# Patient Record
Sex: Female | Born: 1995 | Race: Black or African American | Hispanic: No | Marital: Single | Smoking: Never smoker
Health system: Southern US, Community
[De-identification: ages and names within clinical notes are randomized; demographics above are authoritative.]

## PROBLEM LIST (undated history)

## (undated) DIAGNOSIS — R7303 Prediabetes: Secondary | ICD-10-CM

## (undated) DIAGNOSIS — E119 Type 2 diabetes mellitus without complications: Secondary | ICD-10-CM

## (undated) DIAGNOSIS — J45909 Unspecified asthma, uncomplicated: Secondary | ICD-10-CM

## (undated) DIAGNOSIS — E282 Polycystic ovarian syndrome: Secondary | ICD-10-CM

## (undated) DIAGNOSIS — F329 Major depressive disorder, single episode, unspecified: Secondary | ICD-10-CM

## (undated) DIAGNOSIS — A599 Trichomoniasis, unspecified: Secondary | ICD-10-CM

## (undated) DIAGNOSIS — F419 Anxiety disorder, unspecified: Secondary | ICD-10-CM

## (undated) DIAGNOSIS — F319 Bipolar disorder, unspecified: Secondary | ICD-10-CM

## (undated) DIAGNOSIS — F32A Depression, unspecified: Secondary | ICD-10-CM

## (undated) HISTORY — DX: Type 2 diabetes mellitus without complications: E11.9

## (undated) HISTORY — PX: NO PAST SURGERIES: SHX2092

---

## 2017-12-24 DIAGNOSIS — E559 Vitamin D deficiency, unspecified: Secondary | ICD-10-CM | POA: Insufficient documentation

## 2017-12-24 DIAGNOSIS — R7303 Prediabetes: Secondary | ICD-10-CM | POA: Insufficient documentation

## 2018-02-25 DIAGNOSIS — F319 Bipolar disorder, unspecified: Secondary | ICD-10-CM | POA: Insufficient documentation

## 2018-12-24 DIAGNOSIS — H5203 Hypermetropia, bilateral: Secondary | ICD-10-CM | POA: Diagnosis not present

## 2019-01-06 ENCOUNTER — Encounter (HOSPITAL_COMMUNITY): Payer: Self-pay | Admitting: Emergency Medicine

## 2019-01-06 ENCOUNTER — Ambulatory Visit (HOSPITAL_COMMUNITY)
Admission: EM | Admit: 2019-01-06 | Discharge: 2019-01-06 | Disposition: A | Discharge status: Home / Self Care | Payer: Medicaid Other | Attending: Family Medicine | Admitting: Family Medicine

## 2019-01-06 DIAGNOSIS — N926 Irregular menstruation, unspecified: Secondary | ICD-10-CM | POA: Diagnosis not present

## 2019-01-06 DIAGNOSIS — N898 Other specified noninflammatory disorders of vagina: Secondary | ICD-10-CM | POA: Diagnosis not present

## 2019-01-06 DIAGNOSIS — N3090 Cystitis, unspecified without hematuria: Secondary | ICD-10-CM | POA: Diagnosis not present

## 2019-01-06 LAB — POCT URINALYSIS DIP (DEVICE)
Bilirubin Urine: NEGATIVE
Glucose, UA: NEGATIVE mg/dL
Ketones, ur: NEGATIVE mg/dL
Nitrite: NEGATIVE
Protein, ur: NEGATIVE mg/dL
Specific Gravity, Urine: 1.015 (ref 1.005–1.030)
Urobilinogen, UA: 0.2 mg/dL (ref 0.0–1.0)
pH: 7 (ref 5.0–8.0)

## 2019-01-06 LAB — POCT PREGNANCY, URINE: Preg Test, Ur: NEGATIVE

## 2019-01-06 MED ORDER — METRONIDAZOLE 500 MG PO TABS: 500.0000 mg | ORAL_TABLET | Freq: Two times a day (BID) | ORAL | 0 refills | Status: DC

## 2019-01-06 MED ORDER — FLUCONAZOLE 150 MG PO TABS: ORAL_TABLET | ORAL | 0 refills | Status: DC

## 2019-01-06 MED ORDER — CEPHALEXIN 500 MG PO CAPS: 500.0000 mg | ORAL_CAPSULE | Freq: Two times a day (BID) | ORAL | 0 refills | Status: DC

## 2019-01-06 NOTE — ED Triage Notes (Signed)
Pt here for UTI sx and vaginal discharge

## 2019-01-07 LAB — CERVICOVAGINAL ANCILLARY ONLY
Bacterial vaginitis: NEGATIVE
Candida vaginitis: NEGATIVE
Chlamydia: NEGATIVE
Neisseria Gonorrhea: NEGATIVE
Trichomonas: POSITIVE — AB

## 2019-01-08 LAB — URINE CULTURE

## 2019-01-09 NOTE — ED Provider Notes (Signed)
[facility]   [id number] 01/06/19 Arrival Time: 1709  ASSESSMENT & PLAN:  1. Cystitis   2. Vaginal discharge   3. Irregular menstrual cycle    UPT negative.  Will treat empirically for UTI and BV/yeast. Vaginal cytology and urine culture pending.  Meds ordered this encounter  Medications  . cephALEXin (KEFLEX) 500 MG capsule    Sig: Take 1 capsule (500 mg total) by mouth 2 (two) times daily.    Dispense:  10 capsule    Refill:  0  . metroNIDAZOLE (FLAGYL) 500 MG tablet    Sig: Take 1 tablet (500 mg total) by mouth 2 (two) times daily.    Dispense:  14 tablet    Refill:  0  . fluconazole (DIFLUCAN) 150 MG tablet    Sig: Take one tablet by mouth as a single dose. May repeat in 3 days if needed.    Dispense:  2 tablet    Refill:  0   Will notify of any positive results. Instructed to refrain from sexual activity for at least seven days.  Reviewed expectations re: course of current medical issues. Questions answered. Outlined signs and symptoms indicating need for more acute intervention. Patient verbalized understanding. After Visit Summary given.   SUBJECTIVE:  Jordan Williams is a 23 y.o. female who presents with complaint of vaginal discharge and dysuria. Onset abrupt. First noticed several days ago; maybe longer. Describes discharge as thin and white/clear. Questions slight odor. Some vaginal itching. No hematuria. Afebrile. No abdominal or pelvic pain. No n/v. No rashes or lesions. Sexually active with single female partner. OTC treatment: none. H/O BV and yeast infection reported; "feels the same".  No LMP recorded. Irregular periods. Requests UPT.  ROS: As per HPI. All other systems negative   OBJECTIVE:  Vitals:   01/06/19 1732  BP: 99/76  Pulse: (!) 104  Resp: 18  Temp: 98.1 F (36.7 C)  SpO2: 99%    General appearance: alert, cooperative, appears stated age and no distress Throat: lips, mucosa, and tongue normal; teeth and gums normal CV:  RRR Lungs: CTAB Back: no CVA tenderness; FROM at waist Abdomen: soft, non-tender GU: deferred Skin: warm and dry Psychological: alert and cooperative; normal mood and affect.  Results for orders placed or performed during the hospital encounter of 01/06/19  Urine culture  Result Value Ref Range   Specimen Description URINE, RANDOM    Special Requests      NONE Performed at [facility], [address] [place], [place] [address]    Culture      Multiple bacterial morphotypes present, none predominant. Suggest appropriate recollection if clinically indicated.   Report Status 01/08/2019 FINAL   POCT urinalysis dip (device)  Result Value Ref Range   Glucose, UA NEGATIVE NEGATIVE mg/dL   Bilirubin Urine NEGATIVE NEGATIVE   Ketones, ur NEGATIVE NEGATIVE mg/dL   Specific Gravity, Urine 1.015 1.005 - 1.030   Hgb urine dipstick MODERATE (A) NEGATIVE   pH 7.0 5.0 - 8.0   Protein, ur NEGATIVE NEGATIVE mg/dL   Urobilinogen, UA 0.2 0.0 - 1.0 mg/dL   Nitrite NEGATIVE NEGATIVE   Leukocytes, UA MODERATE (A) NEGATIVE  Pregnancy, urine POC  Result Value Ref Range   Preg Test, Ur NEGATIVE NEGATIVE  Cervicovaginal ancillary only  Result Value Ref Range   Bacterial vaginitis Negative for Bacterial Vaginitis Microorganisms    Candida vaginitis Negative for Candida species    Chlamydia Negative    Neisseria gonorrhea Negative  Trichomonas **POSITIVE** (A)     Labs Reviewed  POCT URINALYSIS DIP (DEVICE) - Abnormal; Notable for the following components:      Result Value   Hgb urine dipstick MODERATE (*)    Leukocytes, UA MODERATE (*)    All other components within normal limits  CERVICOVAGINAL ANCILLARY ONLY - Abnormal; Notable for the following components:   Trichomonas **POSITIVE** (*)    All other components within normal limits  URINE CULTURE  POCT PREGNANCY, URINE    No Known Allergies  PMH: As in HPI.  FH: Question of HTN.  Social History   Socioeconomic History   . Marital status: Single    Spouse name: Not on file  . Number of children: Not on file  . Years of education: Not on file  . Highest education level: Not on file  Occupational History  . Not on file  Social Needs  . Financial resource strain: Not on file  . Food insecurity:    Worry: Not on file    Inability: Not on file  . Transportation needs:    Medical: Not on file    Non-medical: Not on file  Tobacco Use  . Smoking status: Never Smoker  . Smokeless tobacco: Never Used  Substance and Sexual Activity  . Alcohol use: Not Currently  . Drug use: Never  . Sexual activity: Not on file  Lifestyle  . Physical activity:    Days per week: Not on file    Minutes per session: Not on file  . Stress: Not on file  Relationships  . Social connections:    Talks on phone: Not on file    Gets together: Not on file    Attends religious service: Not on file    Active member of club or organization: Not on file    Attends meetings of clubs or organizations: Not on file    Relationship status: Not on file  . Intimate partner violence:    Fear of current or ex partner: Not on file    Emotionally abused: Not on file    Physically abused: Not on file    Forced sexual activity: Not on file  Other Topics Concern  . Not on file  Social History Narrative  . Not on file          Mardella LaymanHagler, Daymion Nazaire, MD 01/09/19 [phone]

## 2019-01-11 ENCOUNTER — Telehealth (HOSPITAL_COMMUNITY): Payer: Self-pay | Admitting: Emergency Medicine

## 2019-01-11 NOTE — Telephone Encounter (Signed)
Trichomonas is positive. Flagyl was given at [facility] visit. Pt needs education to refrain from sexual intercourse for 7 days to give the medicine time to work. Sexual partners need to be notified and tested/treated. Condoms may reduce risk of reinfection. Recheck for further evaluation if symptoms are not improving.   Patient contacted and made aware of all results, all questions answered.

## 2019-01-18 ENCOUNTER — Encounter (HOSPITAL_COMMUNITY): Payer: Self-pay

## 2019-01-18 ENCOUNTER — Other Ambulatory Visit: Payer: Self-pay

## 2019-01-18 ENCOUNTER — Ambulatory Visit (HOSPITAL_COMMUNITY)
Admission: EM | Admit: 2019-01-18 | Discharge: 2019-01-18 | Disposition: A | Discharge status: Home / Self Care | Payer: Medicaid Other | Attending: Family Medicine | Admitting: Family Medicine

## 2019-01-18 DIAGNOSIS — Z113 Encounter for screening for infections with a predominantly sexual mode of transmission: Secondary | ICD-10-CM | POA: Insufficient documentation

## 2019-01-18 NOTE — ED Provider Notes (Signed)
[facility]    CSN: [id number] Arrival date & time: 01/18/19  1312     History   Chief Complaint Chief Complaint  Patient presents with  . Appointment    200  . SEXUALLY TRANSMITTED DISEASE    HPI Dia SitterKiyelle Marlett is a 23 y.o. female.   She is a 23 year old female that presents today for STD screening.  She was seen here on 01/06/2019 where she had STD screening done at that point.  She was found to be positive for trichomonas.  She has completed the treatment for trichomonas.  She is here for recheck to ensure the infection has cleared. She is denying any symptoms. She otherwise feels okay.      History reviewed. No pertinent past medical history.  There are no active problems to display for this patient.   History reviewed. No pertinent surgical history.  OB History   No obstetric history on file.      Home Medications    Prior to Admission medications   Not on File    Family History History reviewed. No pertinent family history.  Social History Social History   Tobacco Use  . Smoking status: Never Smoker  . Smokeless tobacco: Never Used  Substance Use Topics  . Alcohol use: Not Currently  . Drug use: Never     Allergies   Patient has no known allergies.   Review of Systems Review of Systems  Constitutional: Negative for activity change, fatigue and fever.  Gastrointestinal: Negative for abdominal pain.  Genitourinary: Negative for decreased urine volume, difficulty urinating, dyspareunia, dysuria, flank pain, frequency, genital sores, hematuria, menstrual problem, pelvic pain, urgency, vaginal bleeding, vaginal discharge and vaginal pain.     Physical Exam Triage Vital Signs ED Triage Vitals  Enc Vitals Group     BP 01/18/19 1345 128/75     Pulse Rate 01/18/19 1345 96     Resp 01/18/19 1345 18     Temp 01/18/19 1345 98.2 F (36.8 C)     Temp Source 01/18/19 1345 Tympanic     SpO2 01/18/19 1345 100 %     Weight 01/18/19 1343  235 lb (106.6 kg)     Height --      Head Circumference --      Peak Flow --      Pain Score 01/18/19 1343 0     Pain Loc --      Pain Edu? --      Excl. in GC? --    No data found.  Updated Vital Signs BP 128/75 (BP Location: Right Arm)   Pulse 96   Temp 98.2 F (36.8 C) (Tympanic)   Resp 18   Wt 235 lb (106.6 kg)   SpO2 100%   Visual Acuity Right Eye Distance:   Left Eye Distance:   Bilateral Distance:    Right Eye Near:   Left Eye Near:    Bilateral Near:     Physical Exam Vitals signs and nursing note reviewed.  Constitutional:      General: She is not in acute distress.    Appearance: She is well-developed.  HENT:     Head: Normocephalic and atraumatic.  Eyes:     Conjunctiva/sclera: Conjunctivae normal.  Neck:     Musculoskeletal: Neck supple.  Cardiovascular:     Rate and Rhythm: Normal rate and regular rhythm.     Heart sounds: No murmur.  Pulmonary:     Effort: Pulmonary effort is normal. No respiratory  distress.     Breath sounds: Normal breath sounds.  Abdominal:     Palpations: Abdomen is soft.     Tenderness: There is no abdominal tenderness.  Genitourinary:    Comments: Deferred  Skin:    General: Skin is warm and dry.  Neurological:     Mental Status: She is alert.      UC Treatments / Results  Labs (all labs ordered are listed, but only abnormal results are displayed) Labs Reviewed  CERVICOVAGINAL ANCILLARY ONLY    EKG None  Radiology No results found.  Procedures Procedures (including critical care time)  Medications Ordered in UC Medications - No data to display  Initial Impression / Assessment and Plan / UC Course  I have reviewed the triage vital signs and the nursing notes.  Pertinent labs & imaging results that were available during my care of the patient were reviewed by me and considered in my medical decision making (see chart for details).     Rescreening patient today for STDs Lab results pending We  will call with any positive results Final Clinical Impressions(s) / UC Diagnoses   Final diagnoses:  Screening for STD (sexually transmitted disease)     Discharge Instructions     We are rescreening you here today for STDs We will call with any positive results Follow up as needed for continued or worsening symptoms     ED Prescriptions    None     Controlled Substance Prescriptions [place] Controlled Substance Registry consulted? Not Applicable   Janace Aris, NP 01/18/19 1454

## 2019-01-18 NOTE — Discharge Instructions (Signed)
We are rescreening you here today for STDs We will call with any positive results Follow up as needed for continued or worsening symptoms

## 2019-01-18 NOTE — ED Triage Notes (Signed)
Pt cc she has a appt for her annual STD testing pt states she wants to make sure that hert STD has went away as well.

## 2019-01-19 LAB — CERVICOVAGINAL ANCILLARY ONLY
Bacterial vaginitis: NEGATIVE
Candida vaginitis: NEGATIVE
Chlamydia: NEGATIVE
Neisseria Gonorrhea: NEGATIVE
Trichomonas: NEGATIVE

## 2019-02-12 DIAGNOSIS — Z113 Encounter for screening for infections with a predominantly sexual mode of transmission: Secondary | ICD-10-CM | POA: Diagnosis not present

## 2019-02-12 DIAGNOSIS — Z3202 Encounter for pregnancy test, result negative: Secondary | ICD-10-CM | POA: Diagnosis not present

## 2019-02-12 DIAGNOSIS — Z30017 Encounter for initial prescription of implantable subdermal contraceptive: Secondary | ICD-10-CM | POA: Diagnosis not present

## 2019-03-01 ENCOUNTER — Encounter: Payer: Self-pay | Admitting: Emergency Medicine

## 2019-03-01 ENCOUNTER — Ambulatory Visit
Admission: EM | Admit: 2019-03-01 | Discharge: 2019-03-01 | Disposition: A | Discharge status: Home / Self Care | Payer: Medicaid Other | Attending: Family Medicine | Admitting: Family Medicine

## 2019-03-01 DIAGNOSIS — Z7251 High risk heterosexual behavior: Secondary | ICD-10-CM | POA: Insufficient documentation

## 2019-03-01 DIAGNOSIS — Z202 Contact with and (suspected) exposure to infections with a predominantly sexual mode of transmission: Secondary | ICD-10-CM | POA: Diagnosis not present

## 2019-03-01 NOTE — ED Triage Notes (Signed)
Pt presents to [facility] for her "annual STD check".
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
## 2019-03-01 NOTE — Discharge Instructions (Addendum)
We did lab testing during this visit.  If there are any abnormal findings that require change in medicine or indicate a positive result, you will be notified.  If all of your tests are normal, you will not be called.   ° °

## 2019-03-01 NOTE — ED Notes (Signed)
Patient able to ambulate independently  

## 2019-03-02 LAB — HIV ANTIBODY (ROUTINE TESTING W REFLEX): HIV Screen 4th Generation wRfx: NONREACTIVE

## 2019-03-02 LAB — RPR: RPR Ser Ql: NONREACTIVE

## 2019-03-03 LAB — CERVICOVAGINAL ANCILLARY ONLY
Chlamydia: NEGATIVE
Neisseria Gonorrhea: NEGATIVE
Trichomonas: POSITIVE — AB

## 2019-03-04 ENCOUNTER — Encounter (HOSPITAL_COMMUNITY): Payer: Self-pay

## 2019-03-04 ENCOUNTER — Telehealth (HOSPITAL_COMMUNITY): Payer: Self-pay | Admitting: Emergency Medicine

## 2019-03-04 MED ORDER — METRONIDAZOLE 500 MG PO TABS: 2000.0000 mg | ORAL_TABLET | Freq: Once | ORAL | 0 refills | Status: AC

## 2019-03-04 NOTE — Telephone Encounter (Signed)
Trichomonas is positive. Rx  for Flagyl 2 grams, once was sent to the pharmacy of record. Pt needs education to refrain from sexual intercourse for 7 days to give the medicine time to work. Sexual partners need to be notified and tested/treated. Condoms may reduce risk of reinfection. Recheck for further evaluation if symptoms are not improving.   Attempted to reach patient. No answer at this time. No voicemail set up.

## 2019-03-04 NOTE — Telephone Encounter (Signed)
Patient called back, all questions answered.

## 2019-03-05 ENCOUNTER — Telehealth: Payer: Self-pay | Admitting: Emergency Medicine

## 2019-03-11 ENCOUNTER — Ambulatory Visit
Admission: EM | Admit: 2019-03-11 | Discharge: 2019-03-11 | Disposition: A | Discharge status: Home / Self Care | Payer: Medicaid Other | Attending: Physician Assistant | Admitting: Physician Assistant

## 2019-03-11 DIAGNOSIS — Z113 Encounter for screening for infections with a predominantly sexual mode of transmission: Secondary | ICD-10-CM | POA: Diagnosis not present

## 2019-03-11 LAB — POCT URINALYSIS DIP (MANUAL ENTRY)
Bilirubin, UA: NEGATIVE
Blood, UA: NEGATIVE
Glucose, UA: NEGATIVE mg/dL
Ketones, POC UA: NEGATIVE mg/dL
Leukocytes, UA: NEGATIVE
Nitrite, UA: NEGATIVE
Protein Ur, POC: NEGATIVE mg/dL
Spec Grav, UA: 1.025 (ref 1.010–1.025)
Urobilinogen, UA: 1 E.U./dL — AB
pH, UA: 7 (ref 5.0–8.0)

## 2019-03-11 NOTE — Discharge Instructions (Signed)
Cytology sent, you will be contacted with any positive results that requires further treatment. Refrain from sexual activity until partner has had treatment for 7 days.

## 2019-03-11 NOTE — ED Provider Notes (Signed)
[facility]    CSN: [id number] Arrival date & time: 03/11/19  1201     History   Chief Complaint Chief Complaint  Patient presents with  . Follow-up    HPI Jordan Williams is a 23 y.o. female.   23 year old female comes in for STD recheck. She was treated for trichomonas last week. Took flagyl 2g by mouth. States would like retesting. She denies symptoms. Denies abdominal pain, nausea, vomiting. Denies urinary frequency, dysuria, hematuria. Denies vaginal discharge, itching, pain. Has not had any sexual activity since treatment. States partner also received treatment.      History reviewed. No pertinent past medical history.  There are no active problems to display for this patient.   History reviewed. No pertinent surgical history.  OB History   No obstetric history on file.      Home Medications    Prior to Admission medications   Not on File    Family History No family history on file.  Social History Social History   Tobacco Use  . Smoking status: Never Smoker  . Smokeless tobacco: Never Used  Substance Use Topics  . Alcohol use: Not Currently  . Drug use: Never     Allergies   Patient has no known allergies.   Review of Systems Review of Systems  Reason unable to perform ROS: See HPI as above.     Physical Exam Triage Vital Signs ED Triage Vitals [03/11/19 1213]  Enc Vitals Group     BP 123/83     Pulse Rate 89     Resp 18     Temp 98 F (36.7 C)     Temp Source Oral     SpO2 98 %     Weight      Height      Head Circumference      Peak Flow      Pain Score 0     Pain Loc      Pain Edu?      Excl. in GC?    No data found.  Updated Vital Signs BP 123/83 (BP Location: Left Arm)   Pulse 89   Temp 98 F (36.7 C) (Oral)   Resp 18   SpO2 98%   Physical Exam Constitutional:      General: She is not in acute distress.    Appearance: She is well-developed. She is not diaphoretic.  HENT:     Head:  Normocephalic and atraumatic.  Eyes:     Conjunctiva/sclera: Conjunctivae normal.     Pupils: Pupils are equal, round, and reactive to light.  Neurological:     Mental Status: She is alert and oriented to person, place, and time.    UC Treatments / Results  Labs (all labs ordered are listed, but only abnormal results are displayed) Labs Reviewed  POCT URINALYSIS DIP (MANUAL ENTRY) - Abnormal; Notable for the following components:      Result Value   Urobilinogen, UA 1.0 (*)    All other components within normal limits  CERVICOVAGINAL ANCILLARY ONLY    EKG None  Radiology No results found.  Procedures Procedures (including critical care time)  Medications Ordered in UC Medications - No data to display  Initial Impression / Assessment and Plan / UC Course  I have reviewed the triage vital signs and the nursing notes.  Pertinent labs & imaging results that were available during my care of the patient were reviewed by me and considered in  my medical decision making (see chart for details).    Cytology sent. Discussed to wait till results prior to sexual activity. Return check as needed.   Final Clinical Impressions(s) / UC Diagnoses   Final diagnoses:  Screen for STD (sexually transmitted disease)    ED Prescriptions    None        Belinda Fisher, PA-C 03/11/19 1251

## 2019-03-11 NOTE — ED Triage Notes (Signed)
Pt states tx'd for trich over a week ago, wants to follow up that its gone. Pt denies s/s

## 2019-03-15 ENCOUNTER — Telehealth (HOSPITAL_COMMUNITY): Payer: Self-pay | Admitting: Emergency Medicine

## 2019-03-15 LAB — CERVICOVAGINAL ANCILLARY ONLY
Bacterial vaginitis: POSITIVE — AB
Candida vaginitis: NEGATIVE
Chlamydia: NEGATIVE
Neisseria Gonorrhea: NEGATIVE
Trichomonas: NEGATIVE

## 2019-03-15 MED ORDER — METRONIDAZOLE 500 MG PO TABS: 500.0000 mg | ORAL_TABLET | Freq: Two times a day (BID) | ORAL | 0 refills | Status: DC

## 2019-03-15 NOTE — Telephone Encounter (Signed)
Bacterial vaginosis is positive. This was not treated at the urgent care visit.  Flagyl 500 mg BID x 7 days #14 no refills sent to patients pharmacy of choice.    Spoke to pt verbalized understanding  

## 2019-04-22 ENCOUNTER — Other Ambulatory Visit: Payer: Self-pay

## 2019-04-22 ENCOUNTER — Emergency Department (HOSPITAL_COMMUNITY)
Admission: EM | Admit: 2019-04-22 | Discharge: 2019-04-23 | Disposition: A | Discharge status: Home / Self Care | Payer: Medicaid Other | Attending: Emergency Medicine | Admitting: Emergency Medicine

## 2019-04-22 ENCOUNTER — Ambulatory Visit (HOSPITAL_COMMUNITY)
Admission: EM | Admit: 2019-04-22 | Discharge: 2019-04-22 | Disposition: A | Discharge status: Home / Self Care | Payer: No Typology Code available for payment source | Attending: Emergency Medicine | Admitting: Emergency Medicine

## 2019-04-22 ENCOUNTER — Encounter (HOSPITAL_COMMUNITY): Payer: Self-pay | Admitting: Obstetrics and Gynecology

## 2019-04-22 DIAGNOSIS — T7421XA Adult sexual abuse, confirmed, initial encounter: Secondary | ICD-10-CM | POA: Diagnosis not present

## 2019-04-22 DIAGNOSIS — Z0441 Encounter for examination and observation following alleged adult rape: Secondary | ICD-10-CM | POA: Diagnosis not present

## 2019-04-22 DIAGNOSIS — F419 Anxiety disorder, unspecified: Secondary | ICD-10-CM | POA: Diagnosis not present

## 2019-04-22 HISTORY — DX: Major depressive disorder, single episode, unspecified: F32.9

## 2019-04-22 HISTORY — DX: Anxiety disorder, unspecified: F41.9

## 2019-04-22 HISTORY — DX: Depression, unspecified: F32.A

## 2019-04-22 MED ORDER — CEFTRIAXONE SODIUM 250 MG IJ SOLR
250.0000 mg | Freq: Once | INTRAMUSCULAR | Status: AC
  Administered 2019-04-22: 250 mg via INTRAMUSCULAR
  Filled 2019-04-22: qty 250

## 2019-04-22 MED ORDER — AZITHROMYCIN 250 MG PO TABS
1000.0000 mg | ORAL_TABLET | Freq: Once | ORAL | Status: AC
  Administered 2019-04-22: 1000 mg via ORAL
  Filled 2019-04-22: qty 4

## 2019-04-22 MED ORDER — LIDOCAINE HCL (PF) 1 % IJ SOLN
0.9000 mL | Freq: Once | INTRAMUSCULAR | Status: AC
  Administered 2019-04-22: 0.9 mL
  Filled 2019-04-22: qty 30

## 2019-04-22 MED ORDER — METRONIDAZOLE 500 MG PO TABS
2000.0000 mg | ORAL_TABLET | Freq: Once | ORAL | Status: AC
  Administered 2019-04-22: 2000 mg via ORAL
  Filled 2019-04-22: qty 4
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
## 2019-04-22 NOTE — ED Notes (Signed)
Sane Nurse at bedside.
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
## 2019-04-23 LAB — PREGNANCY, URINE: Preg Test, Ur: NEGATIVE
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
## 2019-04-26 NOTE — SANE Note (Signed)
FNE discussed medication options with patient.  Patient was informed of STI prophylaxis options including HIV prophylaxis.  Patient also informed of options for pregnancy prevention medications.  Patient agreed to all SI prophylaxis with the exception of HIV.  The patient also declined pregnancy prevention medication.

## 2019-05-04 ENCOUNTER — Ambulatory Visit (HOSPITAL_COMMUNITY)
Admission: EM | Admit: 2019-05-04 | Discharge: 2019-05-04 | Disposition: A | Discharge status: Home / Self Care | Payer: Medicaid Other | Attending: Family Medicine | Admitting: Family Medicine

## 2019-05-04 ENCOUNTER — Encounter (HOSPITAL_COMMUNITY): Payer: Self-pay

## 2019-05-04 ENCOUNTER — Other Ambulatory Visit: Payer: Self-pay

## 2019-05-04 DIAGNOSIS — Z3202 Encounter for pregnancy test, result negative: Secondary | ICD-10-CM

## 2019-05-04 DIAGNOSIS — K625 Hemorrhage of anus and rectum: Secondary | ICD-10-CM

## 2019-05-04 DIAGNOSIS — N898 Other specified noninflammatory disorders of vagina: Secondary | ICD-10-CM

## 2019-05-04 LAB — POCT URINALYSIS DIP (DEVICE)
Bilirubin Urine: NEGATIVE
Glucose, UA: NEGATIVE mg/dL
Hgb urine dipstick: NEGATIVE
Ketones, ur: NEGATIVE mg/dL
Leukocytes,Ua: NEGATIVE
Nitrite: NEGATIVE
Protein, ur: NEGATIVE mg/dL
Specific Gravity, Urine: >=1.03 (ref 1.005–1.030)
Urobilinogen, UA: 1 mg/dL (ref 0.0–1.0)
pH: 7 (ref 5.0–8.0)

## 2019-05-04 LAB — POCT PREGNANCY, URINE: Preg Test, Ur: NEGATIVE

## 2019-05-04 MED ORDER — HYDROCORTISONE ACETATE 25 MG RE SUPP: 25.0000 mg | Freq: Two times a day (BID) | RECTAL | 0 refills | Status: DC

## 2019-05-04 NOTE — ED Notes (Signed)
Patient verbalizes understanding of discharge instructions. Opportunity for questioning and answers were provided. Patient discharged from [facility] by provider.  

## 2019-05-04 NOTE — Discharge Instructions (Addendum)
We will send the swab off to check for causes of discharge.  We will call you with the results of this later this week.  We are checking for gonorrhea, chlamydia, trichomonas, yeast, BV.  No source of bleeding visualized, but please use Anusol suppositories as needed for bleeding twice daily.  If having persistent bleeding please follow-up with gastroenterology for further evaluation.  To help reduce constipation and promote bowel health: 1. Drink at least 64 ounces of water each day 2. Eat plenty of fiber (fruits, vegetables, whole grains, legumes) 3. Be physically active or exercise including walking, jogging, swimming, yoga, etc. 4. For active constipation use a stool softener (docusate) or an osmotic laxative (like Miralax) each day, or as needed.  Follow-up if symptoms not resolving or worsening
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
## 2019-05-05 LAB — CERVICOVAGINAL ANCILLARY ONLY
Bacterial vaginitis: POSITIVE — AB
Candida vaginitis: NEGATIVE
Chlamydia: NEGATIVE
Neisseria Gonorrhea: NEGATIVE
Trichomonas: POSITIVE — AB

## 2019-05-06 ENCOUNTER — Telehealth (HOSPITAL_COMMUNITY): Payer: Self-pay | Admitting: Emergency Medicine

## 2019-05-06 MED ORDER — METRONIDAZOLE 500 MG PO TABS: 500.0000 mg | ORAL_TABLET | Freq: Two times a day (BID) | ORAL | 0 refills | Status: AC

## 2019-05-06 NOTE — Telephone Encounter (Signed)
Bacterial vaginosis is positive. This was not treated at the urgent care visit.  Flagyl 500 mg BID x 7 days #14 no refills sent to patients pharmacy of choice.    Trichomonas is positive. Rx  for Flagyl 2 grams, once was sent to the pharmacy of record. Pt needs education to refrain from sexual intercourse for 7 days to give the medicine time to work. Sexual partners need to be notified and tested/treated. Condoms may reduce risk of reinfection. Recheck for further evaluation if symptoms are not improving.  Patient contacted and made aware of all results, all questions answered.   

## 2019-08-25 DIAGNOSIS — H5213 Myopia, bilateral: Secondary | ICD-10-CM | POA: Diagnosis not present

## 2019-09-09 DIAGNOSIS — H52223 Regular astigmatism, bilateral: Secondary | ICD-10-CM | POA: Diagnosis not present

## 2019-09-09 DIAGNOSIS — H5203 Hypermetropia, bilateral: Secondary | ICD-10-CM | POA: Diagnosis not present

## 2019-11-10 DIAGNOSIS — N760 Acute vaginitis: Secondary | ICD-10-CM | POA: Diagnosis not present

## 2019-12-01 ENCOUNTER — Ambulatory Visit
Admission: EM | Admit: 2019-12-01 | Discharge: 2019-12-01 | Disposition: A | Discharge status: Home / Self Care | Payer: Medicaid Other | Attending: Emergency Medicine | Admitting: Emergency Medicine

## 2019-12-01 DIAGNOSIS — Z7251 High risk heterosexual behavior: Secondary | ICD-10-CM | POA: Diagnosis not present

## 2019-12-01 DIAGNOSIS — Z113 Encounter for screening for infections with a predominantly sexual mode of transmission: Secondary | ICD-10-CM | POA: Insufficient documentation

## 2019-12-01 DIAGNOSIS — Z3202 Encounter for pregnancy test, result negative: Secondary | ICD-10-CM

## 2019-12-01 HISTORY — DX: Prediabetes: R73.03

## 2019-12-01 LAB — POCT URINE PREGNANCY: Preg Test, Ur: NEGATIVE

## 2019-12-01 NOTE — Discharge Instructions (Addendum)
Testing for chlamydia, gonorrhea, trichomonas is pending: please look for these results on the MyChart app/website.  We will notify you if you are positive and outline treatment at that time.

## 2019-12-01 NOTE — ED Provider Notes (Signed)
[facility]    CSN: [id number] Arrival date & time: 12/01/19  0849      History   Chief Complaint Chief Complaint  Patient presents with  . SEXUALLY TRANSMITTED DISEASE    HPI Jordan Williams is a 23 y.o. female presenting for STD check.  Currently sexually active with one, new female partner using condoms fairly consistently, though states it broke last time.  Patient not currently on OCP.  LMP 11/13/2019.  Denies vaginal discharge, pelvic pain, fever.   Past Medical History:  Diagnosis Date  . Anxiety   . Depression   . Pre-diabetes     There are no active problems to display for this patient.   History reviewed. No pertinent surgical history.  OB History   No obstetric history on file.      Home Medications    Prior to Admission medications   Not on File    Family History History reviewed. No pertinent family history.  Social History Social History   Tobacco Use  . Smoking status: Never Smoker  . Smokeless tobacco: Never Used  Substance Use Topics  . Alcohol use: Yes  . Drug use: Never     Allergies   Patient has no known allergies.   Review of Systems Review of Systems  Constitutional: Negative for fatigue and fever.  Respiratory: Negative for cough and shortness of breath.   Cardiovascular: Negative for chest pain and palpitations.  Gastrointestinal: Negative for constipation and diarrhea.  Genitourinary: Negative for dysuria, flank pain, frequency, hematuria, pelvic pain, urgency, vaginal bleeding, vaginal discharge and vaginal pain.     Physical Exam Triage Vital Signs ED Triage Vitals  Enc Vitals Group     BP      Pulse      Resp      Temp      Temp src      SpO2      Weight      Height      Head Circumference      Peak Flow      Pain Score      Pain Loc      Pain Edu?      Excl. in [place]?    No data found.  Updated Vital Signs BP 133/88 (BP Location: Left Arm)   Pulse 98   Temp 98.2 F (36.8 C) (Oral)    Resp 16   LMP 11/13/2019   SpO2 96%   Visual Acuity Right Eye Distance:   Left Eye Distance:   Bilateral Distance:    Right Eye Near:   Left Eye Near:    Bilateral Near:     Physical Exam Constitutional:      General: She is not in acute distress. HENT:     Head: Normocephalic and atraumatic.  Eyes:     General: No scleral icterus.    Pupils: Pupils are equal, round, and reactive to light.  Cardiovascular:     Rate and Rhythm: Normal rate.  Pulmonary:     Effort: Pulmonary effort is normal.  Abdominal:     General: Bowel sounds are normal.     Palpations: Abdomen is soft.     Tenderness: There is no abdominal tenderness. There is no right CVA tenderness, left CVA tenderness or guarding.  Genitourinary:    Comments: Patient declined, self-swab performed Skin:    Coloration: Skin is not jaundiced or pale.  Neurological:     Mental Status: She is alert and oriented to  person, place, and time.      UC Treatments / Results  Labs (all labs ordered are listed, but only abnormal results are displayed) Labs Reviewed  POCT URINE PREGNANCY - Normal  CERVICOVAGINAL ANCILLARY ONLY    EKG   Radiology No results found.  Procedures Procedures (including critical care time)  Medications Ordered in UC Medications - No data to display  Initial Impression / Assessment and Plan / UC Course  I have reviewed the triage vital signs and the nursing notes.  Pertinent labs & imaging results that were available during my care of the patient were reviewed by me and considered in my medical decision making (see chart for details).     Afebrile, nontoxic.  Urine pregnancy done in office, reviewed by me: Negative.  STD panel pending.  Reviewed safe sex practices.  Return precautions discussed, patient verbalized understanding and is agreeable to plan. Final Clinical Impressions(s) / UC Diagnoses   Final diagnoses:  Screening examination for venereal disease  Unprotected sex      Discharge Instructions     Testing for chlamydia, gonorrhea, trichomonas is pending: please look for these results on the MyChart app/website.  We will notify you if you are positive and outline treatment at that time.    ED Prescriptions    None     PDMP not reviewed this encounter.   Odette Fraction [place], [place] 12/01/19 [phone]

## 2019-12-01 NOTE — ED Triage Notes (Signed)
Pt requesting STD checking, denies sx's

## 2019-12-02 LAB — CERVICOVAGINAL ANCILLARY ONLY
Chlamydia: NEGATIVE
Neisseria Gonorrhea: NEGATIVE
Trichomonas: NEGATIVE

## 2019-12-04 ENCOUNTER — Ambulatory Visit (HOSPITAL_COMMUNITY)
Admission: EM | Admit: 2019-12-04 | Discharge: 2019-12-04 | Disposition: A | Discharge status: Home / Self Care | Payer: Medicaid Other | Attending: Family Medicine | Admitting: Family Medicine

## 2019-12-04 ENCOUNTER — Encounter (HOSPITAL_COMMUNITY): Payer: Self-pay

## 2019-12-04 ENCOUNTER — Other Ambulatory Visit: Payer: Self-pay

## 2019-12-04 DIAGNOSIS — N898 Other specified noninflammatory disorders of vagina: Secondary | ICD-10-CM | POA: Insufficient documentation

## 2019-12-04 DIAGNOSIS — Z3202 Encounter for pregnancy test, result negative: Secondary | ICD-10-CM | POA: Diagnosis not present

## 2019-12-04 LAB — POCT URINALYSIS DIP (DEVICE)
Bilirubin Urine: NEGATIVE
Glucose, UA: NEGATIVE mg/dL
Hgb urine dipstick: NEGATIVE
Ketones, ur: NEGATIVE mg/dL
Nitrite: NEGATIVE
Protein, ur: NEGATIVE mg/dL
Specific Gravity, Urine: 1.025 (ref 1.005–1.030)
Urobilinogen, UA: 0.2 mg/dL (ref 0.0–1.0)
pH: 6.5 (ref 5.0–8.0)

## 2019-12-04 LAB — HIV ANTIBODY (ROUTINE TESTING W REFLEX): HIV Screen 4th Generation wRfx: NONREACTIVE

## 2019-12-04 LAB — POC URINE PREG, ED: Preg Test, Ur: NEGATIVE

## 2019-12-04 LAB — POCT PREGNANCY, URINE: Preg Test, Ur: NEGATIVE

## 2019-12-04 NOTE — ED Triage Notes (Signed)
Pt presents with vaginal discharge and urinary frequency X 2 days.  Pt is also requesting full STD testing with swab and blood.

## 2019-12-04 NOTE — ED Provider Notes (Signed)
[place]    CSN: [id number] Arrival date & time: 12/04/19  1135      History   Chief Complaint Chief Complaint  Patient presents with  . Urinary Tract Infection    HPI Jordan Williams is a 23 y.o. female.   Jordan Williams presents with complaints of change in vaginal discharge as well as more frequent urination, which started approximately 2 days ago. No pain with urination, no urgency. No increased thirst. Shipes/gray colored vaginal discharge. No back pain. No fevers. Has had bv in the past. No currently on birth control. Sexually active  With one partner and doesn't use condoms. No known std exposure. History  Of prediabetes.     ROS per HPI, negative if not otherwise mentioned.      Past Medical History:  Diagnosis Date  . Anxiety   . Depression   . Pre-diabetes     There are no problems to display for this patient.   History reviewed. No pertinent surgical history.  OB History   No obstetric history on file.      Home Medications    Prior to Admission medications   Not on File    Family History History reviewed. No pertinent family history.  Social History Social History   Tobacco Use  . Smoking status: Never Smoker  . Smokeless tobacco: Never Used  Substance Use Topics  . Alcohol use: Yes  . Drug use: Never     Allergies   Patient has no known allergies.   Review of Systems Review of Systems   Physical Exam Triage Vital Signs ED Triage Vitals [12/04/19 1233]  Enc Vitals Group     BP 125/74     Pulse Rate 69     Resp 18     Temp 98.4 F (36.9 C)     Temp Source Oral     SpO2 100 %     Weight      Height      Head Circumference      Peak Flow      Pain Score 0     Pain Loc      Pain Edu?      Excl. in [place]?    No data found.  Updated Vital Signs BP 125/74 (BP Location: Left Arm)   Pulse 69   Temp 98.4 F (36.9 C) (Oral)   Resp 18   LMP 11/13/2019   SpO2 100%   Visual Acuity Right Eye Distance:     Left Eye Distance:   Bilateral Distance:    Right Eye Near:   Left Eye Near:    Bilateral Near:     Physical Exam Constitutional:      General: She is not in acute distress.    Appearance: She is well-developed.  Cardiovascular:     Rate and Rhythm: Normal rate.  Pulmonary:     Effort: Pulmonary effort is normal.  Abdominal:     Palpations: Abdomen is soft. Abdomen is not rigid.     Tenderness: There is no abdominal tenderness. There is no guarding or rebound.  Genitourinary:    Comments: Denies sores, lesions, vaginal bleeding; no pelvic pain; gu exam deferred at this time, vaginal self swab collected.   Skin:    General: Skin is warm and dry.  Neurological:     Mental Status: She is alert and oriented to person, place, and time.      UC Treatments / Results  Labs (all labs ordered  are listed, but only abnormal results are displayed) Labs Reviewed  POCT URINALYSIS DIP (DEVICE) - Abnormal; Notable for the following components:      Result Value   Leukocytes,Ua TRACE (*)    All other components within normal limits  URINE CULTURE  RPR  HIV ANTIBODY (ROUTINE TESTING W REFLEX)  POC URINE PREG, ED  POCT PREGNANCY, URINE  CERVICOVAGINAL ANCILLARY ONLY    EKG   Radiology No results found.  Procedures Procedures (including critical care time)  Medications Ordered in UC Medications - No data to display  Initial Impression / Assessment and Plan / UC Course  I have reviewed the triage vital signs and the nursing notes.  Pertinent labs & imaging results that were available during my care of the patient were reviewed by me and considered in my medical decision making (see chart for details).     Trace leuks only to UA, culture obtained to ensure no UTI. Vaginal cytology pending, as well as HIV and RPR per patient request. Will notify of any positive findings and if any changes to treatment are needed.   No glucose to urine. Return precautions provided. If symptoms  worsen or do not improve in the next week to return to be seen or to follow up with PCP.  Patient verbalized understanding and agreeable to plan.   Final Clinical Impressions(s) / UC Diagnoses   Final diagnoses:  Vaginal discharge     Discharge Instructions     Your urine looks well today, no current indication of infection. I have sent it to be cultured to confirm this.  Will notify you of any positive findings from your vaginal swab and HIV/ syphilis screening, and if any changes to treatment are needed.  You may monitor your results on your MyChart online as well.  If symptoms worsen or do not improve in the next week to return to be seen or to follow up with your PCP.     ED Prescriptions    None     PDMP not reviewed this encounter.   Georgetta Haber, NP 12/04/19 1319

## 2019-12-04 NOTE — Discharge Instructions (Signed)
Your urine looks well today, no current indication of infection. I have sent it to be cultured to confirm this.  Will notify you of any positive findings from your vaginal swab and HIV/ syphilis screening, and if any changes to treatment are needed.  You may monitor your results on your MyChart online as well.  If symptoms worsen or do not improve in the next week to return to be seen or to follow up with your PCP.

## 2019-12-05 LAB — URINE CULTURE: Culture: <10000 — AB

## 2019-12-05 LAB — RPR: RPR Ser Ql: NONREACTIVE

## 2019-12-07 LAB — CERVICOVAGINAL ANCILLARY ONLY
Bacterial vaginitis: NEGATIVE
Candida vaginitis: NEGATIVE
Chlamydia: NEGATIVE
Neisseria Gonorrhea: NEGATIVE
Trichomonas: NEGATIVE

## 2019-12-13 ENCOUNTER — Ambulatory Visit: Payer: Medicaid Other | Attending: Internal Medicine

## 2019-12-13 DIAGNOSIS — Z20822 Contact with and (suspected) exposure to covid-19: Secondary | ICD-10-CM

## 2019-12-14 LAB — NOVEL CORONAVIRUS, NAA: SARS-CoV-2, NAA: NOT DETECTED

## 2019-12-28 ENCOUNTER — Ambulatory Visit: Payer: Medicaid Other | Attending: Internal Medicine

## 2019-12-28 DIAGNOSIS — Z20822 Contact with and (suspected) exposure to covid-19: Secondary | ICD-10-CM

## 2019-12-30 LAB — NOVEL CORONAVIRUS, NAA: SARS-CoV-2, NAA: NOT DETECTED

## 2019-12-31 ENCOUNTER — Telehealth: Payer: Self-pay

## 2019-12-31 NOTE — Telephone Encounter (Signed)
Pt notified of negative COVID-19 results. Understanding verbalized.  Chasta M Hopkins   

## 2020-01-10 ENCOUNTER — Other Ambulatory Visit: Payer: Self-pay

## 2020-01-10 DIAGNOSIS — Z5321 Procedure and treatment not carried out due to patient leaving prior to being seen by health care provider: Secondary | ICD-10-CM | POA: Diagnosis not present

## 2020-01-10 DIAGNOSIS — N939 Abnormal uterine and vaginal bleeding, unspecified: Secondary | ICD-10-CM | POA: Insufficient documentation

## 2020-01-10 NOTE — ED Triage Notes (Signed)
Pt reports having vaginal bleeding that was heavier than normal. Pt reports having tissue as well. Pt reported that period started on 01/07/2020

## 2020-01-11 ENCOUNTER — Encounter (HOSPITAL_COMMUNITY): Payer: Self-pay | Admitting: Emergency Medicine

## 2020-01-11 ENCOUNTER — Emergency Department (HOSPITAL_COMMUNITY)
Admission: EM | Admit: 2020-01-11 | Discharge: 2020-01-11 | Disposition: A | Discharge status: Home / Self Care | Payer: Medicaid Other | Attending: Emergency Medicine | Admitting: Emergency Medicine

## 2020-01-11 ENCOUNTER — Other Ambulatory Visit: Payer: Self-pay

## 2020-01-11 LAB — I-STAT BETA HCG BLOOD, ED (MC, WL, AP ONLY): I-stat hCG, quantitative: <5 m[IU]/mL (ref <5)

## 2020-02-11 DIAGNOSIS — R7303 Prediabetes: Secondary | ICD-10-CM | POA: Diagnosis not present

## 2020-02-11 DIAGNOSIS — F319 Bipolar disorder, unspecified: Secondary | ICD-10-CM | POA: Diagnosis not present

## 2020-03-24 DIAGNOSIS — S39012A Strain of muscle, fascia and tendon of lower back, initial encounter: Secondary | ICD-10-CM | POA: Diagnosis not present

## 2020-03-24 DIAGNOSIS — R197 Diarrhea, unspecified: Secondary | ICD-10-CM | POA: Diagnosis not present

## 2020-03-25 DIAGNOSIS — Z1152 Encounter for screening for COVID-19: Secondary | ICD-10-CM | POA: Diagnosis not present

## 2020-03-27 DIAGNOSIS — F319 Bipolar disorder, unspecified: Secondary | ICD-10-CM | POA: Diagnosis not present

## 2020-03-29 DIAGNOSIS — N979 Female infertility, unspecified: Secondary | ICD-10-CM | POA: Diagnosis not present

## 2020-03-29 DIAGNOSIS — Z113 Encounter for screening for infections with a predominantly sexual mode of transmission: Secondary | ICD-10-CM | POA: Diagnosis not present

## 2020-03-29 DIAGNOSIS — R635 Abnormal weight gain: Secondary | ICD-10-CM | POA: Diagnosis not present

## 2020-04-05 DIAGNOSIS — F319 Bipolar disorder, unspecified: Secondary | ICD-10-CM | POA: Diagnosis not present

## 2020-04-12 DIAGNOSIS — F319 Bipolar disorder, unspecified: Secondary | ICD-10-CM | POA: Diagnosis not present

## 2020-04-17 ENCOUNTER — Other Ambulatory Visit: Payer: Medicaid Other

## 2020-04-18 DIAGNOSIS — F319 Bipolar disorder, unspecified: Secondary | ICD-10-CM | POA: Diagnosis not present

## 2020-04-21 DIAGNOSIS — Z3169 Encounter for other general counseling and advice on procreation: Secondary | ICD-10-CM | POA: Diagnosis not present

## 2020-04-21 DIAGNOSIS — Z3200 Encounter for pregnancy test, result unknown: Secondary | ICD-10-CM | POA: Diagnosis not present

## 2020-04-26 DIAGNOSIS — Z3202 Encounter for pregnancy test, result negative: Secondary | ICD-10-CM | POA: Diagnosis not present

## 2020-04-26 DIAGNOSIS — Z113 Encounter for screening for infections with a predominantly sexual mode of transmission: Secondary | ICD-10-CM | POA: Diagnosis not present

## 2020-05-01 DIAGNOSIS — F319 Bipolar disorder, unspecified: Secondary | ICD-10-CM | POA: Diagnosis not present

## 2020-05-08 ENCOUNTER — Ambulatory Visit (HOSPITAL_COMMUNITY)
Admission: EM | Admit: 2020-05-08 | Discharge: 2020-05-08 | Disposition: A | Discharge status: Home / Self Care | Payer: Federal, State, Local not specified - PPO | Attending: Internal Medicine | Admitting: Internal Medicine

## 2020-05-08 ENCOUNTER — Other Ambulatory Visit: Payer: Self-pay

## 2020-05-08 ENCOUNTER — Encounter (HOSPITAL_COMMUNITY): Payer: Self-pay

## 2020-05-08 DIAGNOSIS — Z113 Encounter for screening for infections with a predominantly sexual mode of transmission: Secondary | ICD-10-CM | POA: Insufficient documentation

## 2020-05-08 NOTE — ED Provider Notes (Signed)
[facility]    CSN: [id number] Arrival date & time: 05/08/20  1418      History   Chief Complaint Chief Complaint  Patient presents with  . STI test    HPI Jordan Williams is a 24 y.o. female comes to urgent care requesting STD evaluation.  Patient has no symptoms.Marland Kitchen   HPI  Past Medical History:  Diagnosis Date  . Anxiety   . Depression   . Pre-diabetes     There are no problems to display for this patient.   History reviewed. No pertinent surgical history.  OB History   No obstetric history on file.      Home Medications    Prior to Admission medications   Medication Sig Start Date End Date Taking? Authorizing Provider  lamoTRIgine (LAMICTAL) 25 MG tablet Take 50 mg by mouth at bedtime. 04/28/20   [provider]  sertraline (ZOLOFT) 50 MG tablet Take 50 mg by mouth at bedtime. 04/28/20   [provider]    Family History Family History  Problem Relation Age of Onset  . Healthy Mother   . Healthy Father     Social History Social History   Tobacco Use  . Smoking status: Never Smoker  . Smokeless tobacco: Never Used  Substance Use Topics  . Alcohol use: Yes    Comment: occ  . Drug use: Never     Allergies   Patient has no known allergies.   Review of Systems Review of Systems  Gastrointestinal: Negative.   Genitourinary: Negative.   Musculoskeletal: Negative.   Neurological: Negative.      Physical Exam Triage Vital Signs ED Triage Vitals [05/08/20 1453]  Enc Vitals Group     BP 114/70     Pulse Rate 91     Resp 16     Temp 98.2 F (36.8 C)     Temp Source Oral     SpO2 100 %     Weight 225 lb (102.1 kg)     Height 5\' 3"  (1.6 m)     Head Circumference      Peak Flow      Pain Score 0     Pain Loc      Pain Edu?      Excl. in [place]?    No data found.  Updated Vital Signs BP 114/70   Pulse 91   Temp 98.2 F (36.8 C) (Oral)   Resp 16   Ht 5\' 3"  (1.6 m)   Wt 102.1 kg   SpO2 100%   BMI 39.86  kg/m   Visual Acuity Right Eye Distance:   Left Eye Distance:   Bilateral Distance:    Right Eye Near:   Left Eye Near:    Bilateral Near:     Physical Exam Vitals and nursing note reviewed.  Cardiovascular:     Rate and Rhythm: Normal rate and regular rhythm.     Pulses: Normal pulses.     Heart sounds: Normal heart sounds.      UC Treatments / Results  Labs (all labs ordered are listed, but only abnormal results are displayed) Labs Reviewed  HIV-1 RNA QUANT-NO REFLEX-BLD  RPR  CERVICOVAGINAL ANCILLARY ONLY    EKG   Radiology No results found.  Procedures Procedures (including critical care time)  Medications Ordered in UC Medications - No data to display  Initial Impression / Assessment and Plan / UC Course  I have reviewed the triage vital signs and the  nursing notes.  Pertinent labs & imaging results that were available during my care of the patient were reviewed by me and considered in my medical decision making (see chart for details).     1.  Screen for STDs: Cervicovaginal for GC/chlamydia/trichomonas HIV, RPR Safe sex counseling given. Return precautions given Final Clinical Impressions(s) / UC Diagnoses   Final diagnoses:  Screen for STD (sexually transmitted disease)   Discharge Instructions   None    ED Prescriptions    None     PDMP not reviewed this encounter.   Merrilee Jansky, MD 05/08/20 2032

## 2020-05-08 NOTE — ED Triage Notes (Signed)
Pt wants tested for STIs. Pt denies symptoms at this time.

## 2020-05-09 LAB — CERVICOVAGINAL ANCILLARY ONLY
Chlamydia: NEGATIVE
Comment: NEGATIVE
Comment: NEGATIVE
Comment: NORMAL
Neisseria Gonorrhea: POSITIVE — AB
Trichomonas: NEGATIVE

## 2020-05-09 LAB — RPR: RPR Ser Ql: NONREACTIVE

## 2020-05-09 LAB — HIV-1 RNA QUANT-NO REFLEX-BLD
HIV 1 RNA Quant: <20 copies/mL
LOG10 HIV-1 RNA: UNDETERMINED log10copy/mL

## 2020-05-10 DIAGNOSIS — Z7251 High risk heterosexual behavior: Secondary | ICD-10-CM | POA: Diagnosis not present

## 2020-05-10 DIAGNOSIS — A549 Gonococcal infection, unspecified: Secondary | ICD-10-CM | POA: Diagnosis not present

## 2020-05-10 DIAGNOSIS — A64 Unspecified sexually transmitted disease: Secondary | ICD-10-CM | POA: Diagnosis not present

## 2020-05-10 DIAGNOSIS — F319 Bipolar disorder, unspecified: Secondary | ICD-10-CM | POA: Diagnosis not present

## 2020-05-12 ENCOUNTER — Telehealth (HOSPITAL_COMMUNITY): Payer: Self-pay | Admitting: Orthopedic Surgery

## 2020-05-17 DIAGNOSIS — F319 Bipolar disorder, unspecified: Secondary | ICD-10-CM | POA: Diagnosis not present

## 2020-05-28 DIAGNOSIS — Z20822 Contact with and (suspected) exposure to covid-19: Secondary | ICD-10-CM | POA: Diagnosis not present

## 2020-05-28 DIAGNOSIS — F410 Panic disorder [episodic paroxysmal anxiety] without agoraphobia: Secondary | ICD-10-CM | POA: Diagnosis not present

## 2020-05-28 DIAGNOSIS — F430 Acute stress reaction: Secondary | ICD-10-CM | POA: Diagnosis not present

## 2020-05-28 DIAGNOSIS — Z03818 Encounter for observation for suspected exposure to other biological agents ruled out: Secondary | ICD-10-CM | POA: Diagnosis not present

## 2020-06-01 DIAGNOSIS — F319 Bipolar disorder, unspecified: Secondary | ICD-10-CM | POA: Diagnosis not present

## 2020-06-02 ENCOUNTER — Ambulatory Visit (HOSPITAL_COMMUNITY)
Admission: EM | Admit: 2020-06-02 | Discharge: 2020-06-02 | Disposition: A | Discharge status: Home / Self Care | Payer: Federal, State, Local not specified - PPO | Attending: Emergency Medicine | Admitting: Emergency Medicine

## 2020-06-02 ENCOUNTER — Other Ambulatory Visit: Payer: Self-pay

## 2020-06-02 ENCOUNTER — Encounter (HOSPITAL_COMMUNITY): Payer: Self-pay

## 2020-06-02 DIAGNOSIS — Z3202 Encounter for pregnancy test, result negative: Secondary | ICD-10-CM

## 2020-06-02 DIAGNOSIS — N898 Other specified noninflammatory disorders of vagina: Secondary | ICD-10-CM | POA: Diagnosis not present

## 2020-06-02 LAB — POCT URINALYSIS DIP (DEVICE)
Bilirubin Urine: NEGATIVE
Glucose, UA: NEGATIVE mg/dL
Hgb urine dipstick: NEGATIVE
Ketones, ur: NEGATIVE mg/dL
Leukocytes,Ua: NEGATIVE
Nitrite: NEGATIVE
Protein, ur: NEGATIVE mg/dL
Specific Gravity, Urine: 1.025 (ref 1.005–1.030)
Urobilinogen, UA: 1 mg/dL (ref 0.0–1.0)
pH: 7.5 (ref 5.0–8.0)

## 2020-06-02 LAB — POC URINE PREG, ED: Preg Test, Ur: NEGATIVE

## 2020-06-02 NOTE — Discharge Instructions (Signed)
Please continue to monitor symptoms, may be skin sloughing off causing discoloration  We are testing you for Gonorrhea, Chlamydia, Trichomonas, Yeast and Bacterial Vaginosis. We will call you if anything is positive and let you know if you require any further treatment. Please inform partners of any positive results.   Please return if symptoms not improving with treatment, development of fever, nausea, vomiting, abdominal pain.

## 2020-06-02 NOTE — ED Provider Notes (Signed)
[place] [facility]    CSN: [id number] Arrival date & time: 06/02/20  1105      History   Chief Complaint Chief Complaint  Patient presents with  . Vaginitis    HPI Jordan Williams is a 24 y.o. female history of bipolar disorder presenting today for evaluation of abnormal discharge.  Patient reports over the past 4 to 5 days she has noticed Johann discharge with wiping.  She notes that she notices this more externally rather than vaginally and notes that it looks as if small specks around the tissue with wiping.  She denies any urinary symptoms of dysuria, increased frequency urgency or hematuria.  Denies any other associated symptoms with discharge including denying itching, irritation or burning.  Denies pelvic pain.  Does report some recent nausea, without vomiting.  Denies fevers.  Does have a new partner recently.  Last menstrual cycle was 6/1, is not on birth control.  Reports prior history of BV when she was on Nexplanon, but this is been removed and no longer has recurrent issues.  Does not feel like when she has had BV in the past.  HPI  Past Medical History:  Diagnosis Date  . Anxiety   . Depression   . Pre-diabetes     There are no problems to display for this patient.   History reviewed. No pertinent surgical history.  OB History   No obstetric history on file.      Home Medications    Prior to Admission medications   Medication Sig Start Date End Date Taking? Authorizing Provider  lamoTRIgine (LAMICTAL) 25 MG tablet Take 50 mg by mouth at bedtime. 04/28/20   [provider]  sertraline (ZOLOFT) 50 MG tablet Take 50 mg by mouth at bedtime. 04/28/20   [provider]    Family History Family History  Problem Relation Age of Onset  . Healthy Mother   . Healthy Father     Social History Social History   Tobacco Use  . Smoking status: Never Smoker  . Smokeless tobacco: Never Used  Vaping Use  . Vaping Use: Never used  Substance Use  Topics  . Alcohol use: Yes    Comment: occ  . Drug use: Never     Allergies   Patient has no known allergies.   Review of Systems Review of Systems  Constitutional: Negative for fever.  Respiratory: Negative for shortness of breath.   Cardiovascular: Negative for chest pain.  Gastrointestinal: Negative for abdominal pain, diarrhea, nausea and vomiting.  Genitourinary: Positive for vaginal discharge. Negative for dysuria, flank pain, genital sores, hematuria, menstrual problem, vaginal bleeding and vaginal pain.  Musculoskeletal: Negative for back pain.  Skin: Negative for rash.  Neurological: Negative for dizziness, light-headedness and headaches.     Physical Exam Triage Vital Signs ED Triage Vitals  Enc Vitals Group     BP 06/02/20 1152 139/77     Pulse Rate 06/02/20 1152 76     Resp 06/02/20 1152 18     Temp 06/02/20 1152 98.2 F (36.8 C)     Temp Source 06/02/20 1152 Oral     SpO2 06/02/20 1152 98 %     Weight --      Height --      Head Circumference --      Peak Flow --      Pain Score 06/02/20 1153 0     Pain Loc --      Pain Edu? --  Excl. in [place]? --    No data found.  Updated Vital Signs BP 139/77 (BP Location: Right Arm)   Pulse 76   Temp 98.2 F (36.8 C) (Oral)   Resp 18   LMP 05/23/2020   SpO2 98%   Visual Acuity Right Eye Distance:   Left Eye Distance:   Bilateral Distance:    Right Eye Near:   Left Eye Near:    Bilateral Near:     Physical Exam Vitals and nursing note reviewed.  Constitutional:      Appearance: She is well-developed.     Comments: No acute distress  HENT:     Head: Normocephalic and atraumatic.     Nose: Nose normal.  Eyes:     Conjunctiva/sclera: Conjunctivae normal.  Cardiovascular:     Rate and Rhythm: Normal rate.  Pulmonary:     Effort: Pulmonary effort is normal. No respiratory distress.  Abdominal:     General: There is no distension.     Comments: Soft, nondistended, nontender light deep  palpation throughout abdomen  Genitourinary:    Comments: Normal external female genitalia, no obvious rashes or lesions, does have various isolated small skin bumps noted to pubic area, patient points to mons as area where she has noticed specks  Vaginal mucosa pink with white milky discharge present, cervix pink without erythema Musculoskeletal:        General: Normal range of motion.     Cervical back: Neck supple.  Skin:    General: Skin is warm and dry.  Neurological:     Mental Status: She is alert and oriented to person, place, and time.      UC Treatments / Results  Labs (all labs ordered are listed, but only abnormal results are displayed) Labs Reviewed  POC URINE PREG, ED  POCT URINALYSIS DIP (DEVICE)  CERVICOVAGINAL ANCILLARY ONLY    EKG   Radiology No results found.  Procedures Procedures (including critical care time)  Medications Ordered in UC Medications - No data to display  Initial Impression / Assessment and Plan / UC Course  I have reviewed the triage vital signs and the nursing notes.  Pertinent labs & imaging results that were available during my care of the patient were reviewed by me and considered in my medical decision making (see chart for details).     UA unremarkable, pregnancy test negative.  Vaginal swab pending to screen for discharge.  Based off patient's further description seems suspect may be more from sloughing of skin rather than from possible underlying vaginal infection.  Will defer empiric treatment at this time and await results of vaginal swab.  Will provide treatment based off this.  Continue to monitor.  Discussed strict return precautions. Patient verbalized understanding and is agreeable with plan.  Final Clinical Impressions(s) / UC Diagnoses   Final diagnoses:  Vaginal discharge     Discharge Instructions     Please continue to monitor symptoms, may be skin sloughing off causing discoloration  We are testing  you for Gonorrhea, Chlamydia, Trichomonas, Yeast and Bacterial Vaginosis. We will call you if anything is positive and let you know if you require any further treatment. Please inform partners of any positive results.   Please return if symptoms not improving with treatment, development of fever, nausea, vomiting, abdominal pain.     ED Prescriptions    None     PDMP not reviewed this encounter.   Lew Dawes, [place] 06/02/20 1241

## 2020-06-02 NOTE — ED Triage Notes (Signed)
Pt states she gets Ou discharge when she wipes X 5 days.

## 2020-06-05 ENCOUNTER — Telehealth (HOSPITAL_COMMUNITY): Payer: Self-pay | Admitting: Orthopedic Surgery

## 2020-06-05 LAB — CERVICOVAGINAL ANCILLARY ONLY
Bacterial Vaginitis (gardnerella): POSITIVE — AB
Candida Glabrata: NEGATIVE
Candida Vaginitis: NEGATIVE
Chlamydia: NEGATIVE
Comment: NEGATIVE
Comment: NEGATIVE
Comment: NEGATIVE
Comment: NEGATIVE
Comment: NEGATIVE
Comment: NORMAL
Neisseria Gonorrhea: NEGATIVE
Trichomonas: NEGATIVE

## 2020-06-05 MED ORDER — METRONIDAZOLE 500 MG PO TABS: 500.0000 mg | ORAL_TABLET | Freq: Two times a day (BID) | ORAL | 0 refills | Status: DC

## 2020-06-07 DIAGNOSIS — F319 Bipolar disorder, unspecified: Secondary | ICD-10-CM | POA: Diagnosis not present

## 2020-06-14 DIAGNOSIS — Z3202 Encounter for pregnancy test, result negative: Secondary | ICD-10-CM | POA: Diagnosis not present

## 2020-06-23 DIAGNOSIS — F319 Bipolar disorder, unspecified: Secondary | ICD-10-CM | POA: Diagnosis not present

## 2020-06-23 DIAGNOSIS — Z789 Other specified health status: Secondary | ICD-10-CM | POA: Diagnosis not present

## 2020-06-27 DIAGNOSIS — F319 Bipolar disorder, unspecified: Secondary | ICD-10-CM | POA: Diagnosis not present

## 2020-07-03 DIAGNOSIS — Z3009 Encounter for other general counseling and advice on contraception: Secondary | ICD-10-CM | POA: Diagnosis not present

## 2020-07-10 DIAGNOSIS — F319 Bipolar disorder, unspecified: Secondary | ICD-10-CM | POA: Diagnosis not present

## 2020-07-13 DIAGNOSIS — R7303 Prediabetes: Secondary | ICD-10-CM | POA: Diagnosis not present

## 2020-07-13 DIAGNOSIS — N926 Irregular menstruation, unspecified: Secondary | ICD-10-CM | POA: Diagnosis not present

## 2020-07-13 DIAGNOSIS — Z113 Encounter for screening for infections with a predominantly sexual mode of transmission: Secondary | ICD-10-CM | POA: Diagnosis not present

## 2020-07-13 DIAGNOSIS — Z789 Other specified health status: Secondary | ICD-10-CM | POA: Diagnosis not present

## 2020-07-19 DIAGNOSIS — F319 Bipolar disorder, unspecified: Secondary | ICD-10-CM | POA: Diagnosis not present

## 2020-08-01 ENCOUNTER — Other Ambulatory Visit: Payer: Federal, State, Local not specified - PPO

## 2020-08-01 ENCOUNTER — Ambulatory Visit
Admission: EM | Admit: 2020-08-01 | Discharge: 2020-08-01 | Disposition: A | Discharge status: Home / Self Care | Payer: Federal, State, Local not specified - PPO | Attending: Family Medicine | Admitting: Family Medicine

## 2020-08-01 DIAGNOSIS — Z20822 Contact with and (suspected) exposure to covid-19: Secondary | ICD-10-CM | POA: Diagnosis not present

## 2020-08-01 DIAGNOSIS — Z1152 Encounter for screening for COVID-19: Secondary | ICD-10-CM | POA: Diagnosis not present

## 2020-08-01 NOTE — ED Triage Notes (Signed)
Pt came in to get tested for covid 19 for school.

## 2020-08-01 NOTE — ED Provider Notes (Signed)
[facility]    CSN: [id number] Arrival date & time: 08/01/20  1255      History   Chief Complaint Chief Complaint  Patient presents with  . Covid test    HPI Jordan Williams is a 24 y.o. female.   Patient requests test for Covid for school.  She is asymptomatic and has had no known exposures.  HPI  Past Medical History:  Diagnosis Date  . Anxiety   . Depression   . Pre-diabetes     There are no problems to display for this patient.   No past surgical history on file.  OB History   No obstetric history on file.      Home Medications    Prior to Admission medications   Medication Sig Start Date End Date Taking? Authorizing Provider  lamoTRIgine (LAMICTAL) 25 MG tablet Take 50 mg by mouth at bedtime. 04/28/20   [provider]  metroNIDAZOLE (FLAGYL) 500 MG tablet Take 1 tablet (500 mg total) by mouth 2 (two) times daily. 06/05/20   Merrilee Jansky, MD  sertraline (ZOLOFT) 50 MG tablet Take 50 mg by mouth at bedtime. 04/28/20   [provider]    Family History Family History  Problem Relation Age of Onset  . Healthy Mother   . Healthy Father     Social History Social History   Tobacco Use  . Smoking status: Never Smoker  . Smokeless tobacco: Never Used  Vaping Use  . Vaping Use: Never used  Substance Use Topics  . Alcohol use: Yes    Comment: occ  . Drug use: Never     Allergies   Patient has no known allergies.   Review of Systems Review of Systems  All other systems reviewed and are negative.    Physical Exam Triage Vital Signs ED Triage Vitals [08/01/20 1329]  Enc Vitals Group     BP      Pulse      Resp      Temp 98.6 F (37 C)     Temp Source Oral     SpO2      Weight      Height      Head Circumference      Peak Flow      Pain Score      Pain Loc      Pain Edu?      Excl. in [place]?    No data found.  Updated Vital Signs Temp 98.6 F (37 C) (Oral)   Visual Acuity Right Eye Distance:    Left Eye Distance:   Bilateral Distance:    Right Eye Near:   Left Eye Near:    Bilateral Near:     Physical Exam Vitals and nursing note reviewed.  Constitutional:      Appearance: Normal appearance.  Neurological:     Mental Status: She is alert.      UC Treatments / Results  Labs (all labs ordered are listed, but only abnormal results are displayed) Labs Reviewed  NOVEL CORONAVIRUS, NAA    EKG   Radiology No results found.  Procedures Procedures (including critical care time)  Medications Ordered in UC Medications - No data to display  Initial Impression / Assessment and Plan / UC Course  I have reviewed the triage vital signs and the nursing notes.  Pertinent labs & imaging results that were available during my care of the patient were reviewed by me and considered in my medical  decision making (see chart for details).     Covid testing Final Clinical Impressions(s) / UC Diagnoses   Final diagnoses:  Encounter for screening for COVID-19   Discharge Instructions   None    ED Prescriptions    None     PDMP not reviewed this encounter.   Frederica Kuster, MD 08/01/20 1340

## 2020-08-02 LAB — SARS-COV-2, NAA 2 DAY TAT

## 2020-08-02 LAB — NOVEL CORONAVIRUS, NAA: SARS-CoV-2, NAA: NOT DETECTED

## 2020-08-12 DIAGNOSIS — Z3490 Encounter for supervision of normal pregnancy, unspecified, unspecified trimester: Secondary | ICD-10-CM | POA: Diagnosis not present

## 2020-08-14 DIAGNOSIS — F319 Bipolar disorder, unspecified: Secondary | ICD-10-CM | POA: Diagnosis not present

## 2020-08-22 DIAGNOSIS — F319 Bipolar disorder, unspecified: Secondary | ICD-10-CM | POA: Diagnosis not present

## 2020-08-24 DIAGNOSIS — Z113 Encounter for screening for infections with a predominantly sexual mode of transmission: Secondary | ICD-10-CM | POA: Diagnosis not present

## 2020-08-24 DIAGNOSIS — Z01419 Encounter for gynecological examination (general) (routine) without abnormal findings: Secondary | ICD-10-CM | POA: Diagnosis not present

## 2020-08-24 DIAGNOSIS — N926 Irregular menstruation, unspecified: Secondary | ICD-10-CM | POA: Diagnosis not present

## 2020-08-24 DIAGNOSIS — N911 Secondary amenorrhea: Secondary | ICD-10-CM | POA: Diagnosis not present

## 2020-08-24 DIAGNOSIS — Z3201 Encounter for pregnancy test, result positive: Secondary | ICD-10-CM | POA: Diagnosis not present

## 2020-08-29 DIAGNOSIS — N912 Amenorrhea, unspecified: Secondary | ICD-10-CM | POA: Diagnosis not present

## 2020-08-30 DIAGNOSIS — F319 Bipolar disorder, unspecified: Secondary | ICD-10-CM | POA: Diagnosis not present

## 2020-08-31 DIAGNOSIS — N911 Secondary amenorrhea: Secondary | ICD-10-CM | POA: Diagnosis not present

## 2020-09-04 DIAGNOSIS — F319 Bipolar disorder, unspecified: Secondary | ICD-10-CM | POA: Diagnosis not present

## 2020-09-09 ENCOUNTER — Inpatient Hospital Stay (HOSPITAL_COMMUNITY): Payer: Federal, State, Local not specified - PPO

## 2020-09-09 ENCOUNTER — Encounter (HOSPITAL_COMMUNITY): Payer: Self-pay | Admitting: Emergency Medicine

## 2020-09-09 ENCOUNTER — Inpatient Hospital Stay (EMERGENCY_DEPARTMENT_HOSPITAL)
Admission: AD | Admit: 2020-09-09 | Discharge: 2020-09-10 | Disposition: A | Discharge status: Home / Self Care | Payer: Federal, State, Local not specified - PPO | Source: Home / Self Care | Attending: Obstetrics & Gynecology | Admitting: Obstetrics & Gynecology

## 2020-09-09 ENCOUNTER — Other Ambulatory Visit: Payer: Self-pay

## 2020-09-09 ENCOUNTER — Inpatient Hospital Stay (HOSPITAL_COMMUNITY)
Admission: EM | Admit: 2020-09-09 | Discharge: 2020-09-09 | Disposition: A | Discharge status: Home / Self Care | Payer: Federal, State, Local not specified - PPO | Attending: Obstetrics & Gynecology | Admitting: Obstetrics & Gynecology

## 2020-09-09 ENCOUNTER — Encounter (HOSPITAL_COMMUNITY): Payer: Self-pay | Admitting: Obstetrics & Gynecology

## 2020-09-09 DIAGNOSIS — O99341 Other mental disorders complicating pregnancy, first trimester: Secondary | ICD-10-CM | POA: Diagnosis not present

## 2020-09-09 DIAGNOSIS — Z3A01 Less than 8 weeks gestation of pregnancy: Secondary | ICD-10-CM | POA: Insufficient documentation

## 2020-09-09 DIAGNOSIS — O26892 Other specified pregnancy related conditions, second trimester: Secondary | ICD-10-CM | POA: Insufficient documentation

## 2020-09-09 DIAGNOSIS — Z79899 Other long term (current) drug therapy: Secondary | ICD-10-CM | POA: Insufficient documentation

## 2020-09-09 DIAGNOSIS — O208 Other hemorrhage in early pregnancy: Secondary | ICD-10-CM | POA: Diagnosis not present

## 2020-09-09 DIAGNOSIS — R102 Pelvic and perineal pain: Secondary | ICD-10-CM | POA: Diagnosis not present

## 2020-09-09 DIAGNOSIS — R7303 Prediabetes: Secondary | ICD-10-CM | POA: Insufficient documentation

## 2020-09-09 DIAGNOSIS — N939 Abnormal uterine and vaginal bleeding, unspecified: Secondary | ICD-10-CM | POA: Diagnosis not present

## 2020-09-09 DIAGNOSIS — Z3A00 Weeks of gestation of pregnancy not specified: Secondary | ICD-10-CM | POA: Diagnosis not present

## 2020-09-09 DIAGNOSIS — R030 Elevated blood-pressure reading, without diagnosis of hypertension: Secondary | ICD-10-CM | POA: Insufficient documentation

## 2020-09-09 DIAGNOSIS — F418 Other specified anxiety disorders: Secondary | ICD-10-CM | POA: Insufficient documentation

## 2020-09-09 DIAGNOSIS — O4690 Antepartum hemorrhage, unspecified, unspecified trimester: Secondary | ICD-10-CM

## 2020-09-09 DIAGNOSIS — O26851 Spotting complicating pregnancy, first trimester: Secondary | ICD-10-CM | POA: Diagnosis not present

## 2020-09-09 DIAGNOSIS — Z3490 Encounter for supervision of normal pregnancy, unspecified, unspecified trimester: Secondary | ICD-10-CM

## 2020-09-09 DIAGNOSIS — O039 Complete or unspecified spontaneous abortion without complication: Secondary | ICD-10-CM

## 2020-09-09 DIAGNOSIS — O26899 Other specified pregnancy related conditions, unspecified trimester: Secondary | ICD-10-CM

## 2020-09-09 DIAGNOSIS — F319 Bipolar disorder, unspecified: Secondary | ICD-10-CM | POA: Diagnosis not present

## 2020-09-09 DIAGNOSIS — O209 Hemorrhage in early pregnancy, unspecified: Secondary | ICD-10-CM | POA: Diagnosis not present

## 2020-09-09 DIAGNOSIS — Z6740 Type O blood, Rh positive: Secondary | ICD-10-CM

## 2020-09-09 HISTORY — DX: Bipolar disorder, unspecified: F31.9

## 2020-09-09 LAB — COMPREHENSIVE METABOLIC PANEL
ALT: 22 U/L (ref 0–44)
AST: 20 U/L (ref 15–41)
Albumin: 3.9 g/dL (ref 3.5–5.0)
Alkaline Phosphatase: 59 U/L (ref 38–126)
Anion gap: 10 (ref 5–15)
BUN: 8 mg/dL (ref 6–20)
CO2: 24 mmol/L (ref 22–32)
Calcium: 9.3 mg/dL (ref 8.9–10.3)
Chloride: 103 mmol/L (ref 98–111)
Creatinine, Ser: 0.8 mg/dL (ref 0.44–1.00)
GFR calc Af Amer: >60 mL/min (ref >60)
GFR calc non Af Amer: >60 mL/min (ref >60)
Glucose, Bld: 114 mg/dL — ABNORMAL HIGH (ref 70–99)
Potassium: 3.6 mmol/L (ref 3.5–5.1)
Sodium: 137 mmol/L (ref 135–145)
Total Bilirubin: 0.3 mg/dL (ref 0.3–1.2)
Total Protein: 6.7 g/dL (ref 6.5–8.1)

## 2020-09-09 LAB — URINALYSIS, ROUTINE W REFLEX MICROSCOPIC
Bacteria, UA: NONE SEEN
Bilirubin Urine: NEGATIVE
Bilirubin Urine: NEGATIVE
Glucose, UA: NEGATIVE mg/dL
Glucose, UA: NEGATIVE mg/dL
Ketones, ur: 5 mg/dL — AB
Ketones, ur: 20 mg/dL — AB
Leukocytes,Ua: NEGATIVE
Leukocytes,Ua: NEGATIVE
Nitrite: NEGATIVE
Nitrite: NEGATIVE
Protein, ur: NEGATIVE mg/dL
Protein, ur: 30 mg/dL — AB
Specific Gravity, Urine: 1.026 (ref 1.005–1.030)
Specific Gravity, Urine: 1.025 (ref 1.005–1.030)
pH: 5 (ref 5.0–8.0)
pH: 5 (ref 5.0–8.0)

## 2020-09-09 LAB — ABO/RH: ABO/RH(D): O POS

## 2020-09-09 LAB — CBC
HCT: 40.6 % (ref 36.0–46.0)
Hemoglobin: 13.1 g/dL (ref 12.0–15.0)
MCH: 29.9 pg (ref 26.0–34.0)
MCHC: 32.3 g/dL (ref 30.0–36.0)
MCV: 92.7 fL (ref 80.0–100.0)
Platelets: 271 10*3/uL (ref 150–400)
RBC: 4.38 MIL/uL (ref 3.87–5.11)
RDW: 12.8 % (ref 11.5–15.5)
WBC: 10.9 10*3/uL — ABNORMAL HIGH (ref 4.0–10.5)
nRBC: 0 % (ref 0.0–0.2)

## 2020-09-09 LAB — WET PREP, GENITAL
Sperm: NONE SEEN
Trich, Wet Prep: NONE SEEN
Yeast Wet Prep HPF POC: NONE SEEN

## 2020-09-09 LAB — HCG, QUANTITATIVE, PREGNANCY: hCG, Beta Chain, Quant, S: 2333 m[IU]/mL — ABNORMAL HIGH (ref <5)

## 2020-09-09 NOTE — MAU Note (Signed)
Pt brought over from ED, presented there with vaginal bleeding and cramping. Pt reports she is 7 weeks preg.

## 2020-09-09 NOTE — ED Provider Notes (Signed)
MSE was initiated and I personally evaluated the patient and placed orders (if any) at  3:00 AM on September 09, 2020.  The patient appears stable so that the remainder of the MSE may be completed by another provider.  Patient is a 24 year old female estimated to be [redacted] weeks pregnant presents with intermittent vaginal bleeding x 3 days and pelvic cramping that started @ 0100 this AM. Has had [place] & positive pregnancy test.  .  Blood pressure (!) 152/87, pulse (!) 101, temperature 98.7 F (37.1 C), temperature source Oral, resp. rate (!) 22, last menstrual period 06/23/2020, SpO2 100 %.  Patient is nontoxic, her initial tachycardia/tachypnea resolved on my assessment.  Heart RRR, lungs CTA No significant abdominal distension.   03:01: CONSULT: Discussed w/ OBGYN Dr. Despina Hidden- accepts patient in transfer to MAU for further evaluation/management.     Cherly Anderson, PA-C 09/09/20 0303    Gilda Crease, MD 09/09/20 [phone]

## 2020-09-09 NOTE — Discharge Instructions (Signed)
First Trimester of Pregnancy  The first trimester of pregnancy is from week 1 until the end of week 13 (months 1 through 3). During this time, your baby will begin to develop inside you. At 6-8 weeks, the eyes and face are formed, and the heartbeat can be seen on ultrasound. At the end of 12 weeks, all the baby's organs are formed. Prenatal care is all the medical care you receive before the birth of your baby. Make sure you get good prenatal care and follow all of your doctor's instructions. Follow these instructions at home: Medicines  Take over-the-counter and prescription medicines only as told by your doctor. Some medicines are safe and some medicines are not safe during pregnancy.  Take a prenatal vitamin that contains at least 600 micrograms (mcg) of folic acid.  If you have trouble pooping (constipation), take medicine that will make your stool soft (stool softener) if your doctor approves. Eating and drinking   Eat regular, healthy meals.  Your doctor will tell you the amount of weight gain that is right for you.  Avoid raw meat and uncooked cheese.  If you feel sick to your stomach (nauseous) or throw up (vomit): ? Eat 4 or 5 small meals a day instead of 3 large meals. ? Try eating a few soda crackers. ? Drink liquids between meals instead of during meals.  To prevent constipation: ? Eat foods that are high in fiber, like fresh fruits and vegetables, whole grains, and beans. ? Drink enough fluids to keep your pee (urine) clear or pale yellow. Activity  Exercise only as told by your doctor. Stop exercising if you have cramps or pain in your lower belly (abdomen) or low back.  Do not exercise if it is too hot, too humid, or if you are in a place of great height (high altitude).  Try to avoid standing for long periods of time. Move your legs often if you must stand in one place for a long time.  Avoid heavy lifting.  Wear low-heeled shoes. Sit and stand up  straight.  You can have sex unless your doctor tells you not to. Relieving pain and discomfort  Wear a good support bra if your breasts are sore.  Take warm water baths (sitz baths) to soothe pain or discomfort caused by hemorrhoids. Use hemorrhoid cream if your doctor says it is okay.  Rest with your legs raised if you have leg cramps or low back pain.  If you have puffy, bulging veins (varicose veins) in your legs: ? Wear support hose or compression stockings as told by your doctor. ? Raise (elevate) your feet for 15 minutes, 3-4 times a day. ? Limit salt in your food. Prenatal care  Schedule your prenatal visits by the twelfth week of pregnancy.  Write down your questions. Take them to your prenatal visits.  Keep all your prenatal visits as told by your doctor. This is important. Safety  Wear your seat belt at all times when driving.  Make a list of emergency phone numbers. The list should include numbers for family, friends, the hospital, and police and fire departments. General instructions  Ask your doctor for a referral to a local prenatal class. Begin classes no later than at the start of month 6 of your pregnancy.  Ask for help if you need counseling or if you need help with nutrition. Your doctor can give you advice or tell you where to go for help.  Do not use hot tubs, steam   rooms, or saunas.  Do not douche or use tampons or scented sanitary pads.  Do not cross your legs for long periods of time.  Avoid all herbs and alcohol. Avoid drugs that are not approved by your doctor.  Do not use any tobacco products, including cigarettes, chewing tobacco, and electronic cigarettes. If you need help quitting, ask your doctor. You may get counseling or other support to help you quit.  Avoid cat litter boxes and soil used by cats. These carry germs that can cause birth defects in the baby and can cause a loss of your baby (miscarriage) or stillbirth.  Visit your dentist.  At home, brush your teeth with a soft toothbrush. Be gentle when you floss. Contact a doctor if:  You are dizzy.  You have mild cramps or pressure in your lower belly.  You have a nagging pain in your belly area.  You continue to feel sick to your stomach, you throw up, or you have watery poop (diarrhea).  You have a bad smelling fluid coming from your vagina.  You have pain when you pee (urinate).  You have increased puffiness (swelling) in your face, hands, legs, or ankles. Get help right away if:  You have a fever.  You are leaking fluid from your vagina.  You have spotting or bleeding from your vagina.  You have very bad belly cramping or pain.  You gain or lose weight rapidly.  You throw up blood. It may look like coffee grounds.  You are around people who have German measles, fifth disease, or chickenpox.  You have a very bad headache.  You have shortness of breath.  You have any kind of trauma, such as from a fall or a car accident. Summary  The first trimester of pregnancy is from week 1 until the end of week 13 (months 1 through 3).  To take care of yourself and your unborn baby, you will need to eat healthy meals, take medicines only if your doctor tells you to do so, and do activities that are safe for you and your baby.  Keep all follow-up visits as told by your doctor. This is important as your doctor will have to ensure that your baby is healthy and growing well. This information is not intended to replace advice given to you by your health care provider. Make sure you discuss any questions you have with your health care provider. Document Revised: 04/01/2019 Document Reviewed: 12/17/2016 Elsevier Patient Education  2020 [employer]  

## 2020-09-09 NOTE — ED Triage Notes (Signed)
Pt reports she is [redacted] weeks pregnant. C/o abdominal cramping and vaginal bleeding that started tonight.

## 2020-09-09 NOTE — MAU Provider Note (Signed)
History    CSN: [id number]  Arrival date and time: 09/09/20 3664  First Provider Initiated Contact with Patient 09/09/20 0345      Chief Complaint  Patient presents with   Vaginal Bleeding   HPI Jordan Williams is a 24 y.o. G1P0 at [redacted]w[redacted]d who presents to MAU with chief complaint of vaginal bleeding. This is a new problem, onset Wednesday 09/06/2020 after intercourse.  Patient also reports mild abdominal cramping, which she scores as 3/10. She denies aggravating or alleviating factors. She has not taken medication or tried other treatments for this complaint. Most recent sexual intercourse Wednesday, immediately before onset of bleeding.  Patient is s/p pregnancy confirmation and viability ultrasound at [facility].  OB History    Gravida  1   Para      Term      Preterm      AB      Living        SAB      TAB      Ectopic      Multiple      Live Births              Past Medical History:  Diagnosis Date   Anxiety    Bipolar 1 disorder (HCC)    Depression    Pre-diabetes     Past Surgical History:  Procedure Laterality Date   NO PAST SURGERIES      Family History  Problem Relation Age of Onset   Healthy Mother    Healthy Father     Social History   Tobacco Use   Smoking status: Never Smoker   Smokeless tobacco: Never Used  Vaping Use   Vaping Use: Never used  Substance Use Topics   Alcohol use: Yes    Comment: occ   Drug use: Never    Allergies: No Known Allergies  Medications Prior to Admission  Medication Sig Dispense Refill Last Dose   folic acid (FOLVITE) 1 MG tablet Take 1 mg by mouth daily.   09/08/2020 at Unknown time   lamoTRIgine (LAMICTAL) 25 MG tablet Take 50 mg by mouth at bedtime.   09/08/2020 at Unknown time   Prenatal Vit-Fe Fumarate-FA (MULTIVITAMIN-PRENATAL) 27-0.8 MG TABS tablet Take 1 tablet by mouth daily at 12 noon.   09/08/2020 at Unknown time   sertraline (ZOLOFT) 50 MG tablet Take 50 mg by  mouth at bedtime.   09/08/2020 at Unknown time   metroNIDAZOLE (FLAGYL) 500 MG tablet Take 1 tablet (500 mg total) by mouth 2 (two) times daily. 14 tablet 0     Review of Systems  Gastrointestinal: Positive for abdominal pain.  Genitourinary: Positive for vaginal bleeding.  All other systems reviewed and are negative.  Physical Exam   Blood pressure (!) 152/87, pulse (!) 101, temperature 98.7 F (37.1 C), temperature source Oral, resp. rate (!) 22, last menstrual period 06/23/2020, SpO2 100 %.  Physical Exam Vitals and nursing note reviewed. Exam conducted with a chaperone present.  Pulmonary:     Effort: Pulmonary effort is normal.     Breath sounds: Normal breath sounds.  Abdominal:     General: Abdomen is flat.     Tenderness: There is no right CVA tenderness, left CVA tenderness, guarding or rebound.  Genitourinary:    Comments: Pelvic exam: External genitalia normal, vaginal walls pink and well rugated, cervix visually closed, no lesions noted. Scant dark red bleeding in vault.  Skin:    General: Skin is dry.  Capillary Refill: Capillary refill takes less than 2 seconds.  Neurological:     General: No focal deficit present.     Mental Status: She is alert.     MAU Course  Procedures --Clue cells not c/w physical exam. Treatment deferred   Orders Placed This Encounter  Procedures   Wet prep, genital   US OB LESS THAN 14 WEEKS WITH OB TRANSVAGINAL   Urinalysis, Routine w reflex microscopic Urine, Clean Catch   CBC   Comprehensive metabolic panel   hCG, quantitative, pregnancy   POC Urine Pregnancy, ED (not at [facility])   ABO/Rh   Patient Vitals for the past 24 hrs:  BP Temp Temp src Pulse Resp SpO2  09/09/20 0447 130/67 -- -- 77 -- --  09/09/20 0244 (!) 152/87 98.7 F (37.1 C) Oral (!) 101 (!) 22 100 %   Results for orders placed or performed during the hospital encounter of 09/09/20 (from the past 24 hour(s))  Urinalysis, Routine w reflex microscopic  Urine, Clean Catch     Status: Abnormal   Collection Time: 09/09/20  3:29 AM  Result Value Ref Range   Color, Urine YELLOW YELLOW   APPearance CLEAR CLEAR   Specific Gravity, Urine 1.026 1.005 - 1.030   pH 5.0 5.0 - 8.0   Glucose, UA NEGATIVE NEGATIVE mg/dL   Hgb urine dipstick MODERATE (A) NEGATIVE   Bilirubin Urine NEGATIVE NEGATIVE   Ketones, ur 5 (A) NEGATIVE mg/dL   Protein, ur NEGATIVE NEGATIVE mg/dL   Nitrite NEGATIVE NEGATIVE   Leukocytes,Ua NEGATIVE NEGATIVE   RBC / HPF 0-5 0 - 5 RBC/hpf   WBC, UA 0-5 0 - 5 WBC/hpf   Bacteria, UA NONE SEEN NONE SEEN   Squamous Epithelial / LPF 0-5 0 - 5   Mucus PRESENT   CBC     Status: Abnormal   Collection Time: 09/09/20  3:46 AM  Result Value Ref Range   WBC 10.9 (H) 4.0 - 10.5 K/uL   RBC 4.38 3.87 - 5.11 MIL/uL   Hemoglobin 13.1 12.0 - 15.0 g/dL   HCT 13.0 36 - 46 %   MCV 92.7 80.0 - 100.0 fL   MCH 29.9 26.0 - 34.0 pg   MCHC 32.3 30.0 - 36.0 g/dL   RDW 86.5 78.4 - 69.6 %   Platelets 271 150 - 400 K/uL   nRBC 0.0 0.0 - 0.2 %  Comprehensive metabolic panel     Status: Abnormal   Collection Time: 09/09/20  3:46 AM  Result Value Ref Range   Sodium 137 135 - 145 mmol/L   Potassium 3.6 3.5 - 5.1 mmol/L   Chloride 103 98 - 111 mmol/L   CO2 24 22 - 32 mmol/L   Glucose, Bld 114 (H) 70 - 99 mg/dL   BUN 8 6 - 20 mg/dL   Creatinine, Ser 2.95 0.44 - 1.00 mg/dL   Calcium 9.3 8.9 - 28.4 mg/dL   Total Protein 6.7 6.5 - 8.1 g/dL   Albumin 3.9 3.5 - 5.0 g/dL   AST 20 15 - 41 U/L   ALT 22 0 - 44 U/L   Alkaline Phosphatase 59 38 - 126 U/L   Total Bilirubin 0.3 0.3 - 1.2 mg/dL   GFR calc non Af Amer >60 >60 mL/min   GFR calc Af Amer >60 >60 mL/min   Anion gap 10 5 - 15  hCG, quantitative, pregnancy     Status: Abnormal   Collection Time: 09/09/20  3:46 AM  Result Value  Ref Range   hCG, Beta Chain, Quant, S 2,333 (H) <5 mIU/mL  ABO/Rh     Status: None   Collection Time: 09/09/20  3:46 AM  Result Value Ref Range   ABO/RH(D) O POS     No rh immune globuloin      NOT A RH IMMUNE GLOBULIN CANDIDATE, PT RH POSITIVE Performed at [facility], [address], [place], [place] [address]   Wet prep, genital     Status: Abnormal   Collection Time: 09/09/20  3:58 AM   Specimen: PATH Cytology Cervicovaginal Ancillary Only  Result Value Ref Range   Yeast Wet Prep HPF POC NONE SEEN NONE SEEN   Trich, Wet Prep NONE SEEN NONE SEEN   Clue Cells Wet Prep HPF POC PRESENT (A) NONE SEEN   WBC, Wet Prep HPF POC FEW (A) NONE SEEN   Sperm NONE SEEN    US OB LESS THAN 14 WEEKS WITH OB TRANSVAGINAL  Result Date: 09/09/2020 CLINICAL DATA:  Vaginal bleeding. EXAM: OBSTETRIC <14 WK [place] AND TRANSVAGINAL OB US TECHNIQUE: Transvaginal ultrasound was performed for complete evaluation of the gestation as well as the maternal uterus, adnexal regions, and pelvic cul-de-sac. COMPARISON:  None. FINDINGS: Intrauterine gestational sac: Single Yolk sac:  Yes Embryo:  Yes Cardiac Activity: Yes Heart Rate: 135 bpm CRL:   0.89 cm 6 w 5 d                  [place] EDC: 04/30/2021 Maternal uterus/adnexae: Subchorionic hemorrhage: None visualized Right ovary: Normal Left ovary: Normal Other :None Free fluid:  None IMPRESSION: 1. Single living intrauterine gestation with estimated gestational age of [redacted] weeks and 5 days. 2. No findings to explain patient's abnormal vaginal bleeding. Electronically Signed   By: Signa Kell M.D.   On: 09/09/2020 04:22   Assessment and Plan  --24 y.o. G1P0 [redacted]w[redacted]d  --Postcoital bleeding, advised pelvic rest until cleared by [facility] Provider --Blood type O POS --Elevated blood pressure reading  --Discharge home in stable condition  F/U: --[facility], New OB appt Thursday 09/14/2020  Calvert Cantor, CNM 09/09/2020, 6:08 AM

## 2020-09-09 NOTE — MAU Provider Note (Signed)
History     CSN: [id number]  Arrival date and time: 09/09/20 2222   First Provider Initiated Contact with Patient 09/09/20 2300      Chief Complaint  Patient presents with  . Vaginal Bleeding   HPI   Ms.Jordan Williams is a 24 y.o. female G1P0000 @ [redacted]w[redacted]d returning to MAU with concerns about passing tissue. She was seen last night with vaginal bleeding and IUP looked normal on [place].  She had recent intercourse with bleeding following which is what brought her into the hospital yesterday. She reports being concerned about the tissue that she passed. She reports continued bleeding that is similar to what she had last night, and no pain 0/10.   OB History    Gravida  1   Para  0   Term  0   Preterm  0   AB  0   Living  0     SAB  0   TAB  0   Ectopic  0   Multiple  0   Live Births  0           Past Medical History:  Diagnosis Date  . Anxiety   . Bipolar 1 disorder (HCC)   . Depression   . Pre-diabetes     Past Surgical History:  Procedure Laterality Date  . NO PAST SURGERIES      Family History  Problem Relation Age of Onset  . Healthy Mother   . Healthy Father     Social History   Tobacco Use  . Smoking status: Never Smoker  . Smokeless tobacco: Never Used  Vaping Use  . Vaping Use: Never used  Substance Use Topics  . Alcohol use: Yes    Comment: occ  . Drug use: Never    Allergies: No Known Allergies  Medications Prior to Admission  Medication Sig Dispense Refill Last Dose  . folic acid (FOLVITE) 1 MG tablet Take 1 mg by mouth daily.   09/09/2020 at Unknown time  . lamoTRIgine (LAMICTAL) 25 MG tablet Take 50 mg by mouth at bedtime.   09/08/2020 at Unknown time  . Prenatal Vit-Fe Fumarate-FA (MULTIVITAMIN-PRENATAL) 27-0.8 MG TABS tablet Take 1 tablet by mouth daily at 12 noon.   09/09/2020 at Unknown time  . sertraline (ZOLOFT) 50 MG tablet Take 50 mg by mouth at bedtime.   09/08/2020 at Unknown time   Results for orders placed or performed  during the hospital encounter of 09/09/20 (from the past 48 hour(s))  Urinalysis, Routine w reflex microscopic Urine, Clean Catch     Status: Abnormal   Collection Time: 09/09/20 10:41 PM  Result Value Ref Range   Color, Urine YELLOW YELLOW   APPearance HAZY (A) CLEAR   Specific Gravity, Urine 1.025 1.005 - 1.030   pH 5.0 5.0 - 8.0   Glucose, UA NEGATIVE NEGATIVE mg/dL   Hgb urine dipstick LARGE (A) NEGATIVE   Bilirubin Urine NEGATIVE NEGATIVE   Ketones, ur 20 (A) NEGATIVE mg/dL   Protein, ur 30 (A) NEGATIVE mg/dL   Nitrite NEGATIVE NEGATIVE   Leukocytes,Ua NEGATIVE NEGATIVE   RBC / HPF 21-50 0 - 5 RBC/hpf   WBC, UA 0-5 0 - 5 WBC/hpf   Bacteria, UA RARE (A) NONE SEEN   Squamous Epithelial / LPF 0-5 0 - 5   Mucus PRESENT     Comment: Performed at [facility], [address], [place], [place] [address]   [place] Transvaginal  Result Date: 09/10/2020 CLINICAL  DATA:  Vaginal bleeding EXAM: TRANSVAGINAL OB ULTRASOUND TECHNIQUE: Transvaginal ultrasound was performed for complete evaluation of the gestation as well as the maternal uterus, adnexal regions, and pelvic cul-de-sac. COMPARISON:  09/09/2020 FINDINGS: Intrauterine gestational sac: None identified Maternal uterus/adnexae: The uterus is anteverted. The previously identified intrauterine gestational sac is no longer identified in keeping with interval passage. No intrauterine masses are seen. The junctional zone is unremarkable. The cervix is closed. The and medial stripe is uniform measuring 4 mm in thickness. A small amount of simple appearing free fluid is seen within the cul-de-sac. The ovaries are normal in size and echogenicity measuring 1.7 x 4.5 x 3.1 cm on the right and 3.0 x 1.9 x 2.1 cm on the left. No adnexal masses are seen. IMPRESSION: Interval complete abortion. Electronically Signed   By: Helyn Numbers MD   On: 09/10/2020 00:42   US OB LESS THAN 14 WEEKS WITH OB TRANSVAGINAL  Result Date: 09/09/2020 CLINICAL DATA:   Vaginal bleeding. EXAM: OBSTETRIC <14 WK [place] AND TRANSVAGINAL OB US TECHNIQUE: Transvaginal ultrasound was performed for complete evaluation of the gestation as well as the maternal uterus, adnexal regions, and pelvic cul-de-sac. COMPARISON:  None. FINDINGS: Intrauterine gestational sac: Single Yolk sac:  Yes Embryo:  Yes Cardiac Activity: Yes Heart Rate: 135 bpm CRL:   0.89 cm 6 w 5 d                  [place] EDC: 04/30/2021 Maternal uterus/adnexae: Subchorionic hemorrhage: None visualized Right ovary: Normal Left ovary: Normal Other :None Free fluid:  None IMPRESSION: 1. Single living intrauterine gestation with estimated gestational age of [redacted] weeks and 5 days. 2. No findings to explain patient's abnormal vaginal bleeding. Electronically Signed   By: Signa Kell M.D.   On: 09/09/2020 04:22    Review of Systems  Gastrointestinal: Negative for abdominal pain.  Genitourinary: Positive for vaginal bleeding.   Physical Exam   Blood pressure 138/81, pulse 67, temperature 98.6 F (37 C), temperature source Oral, resp. rate 16, weight 108.4 kg, last menstrual period 06/23/2020, SpO2 100 %.  Physical Exam Constitutional:      General: She is not in acute distress.    Appearance: Normal appearance. She is obese. She is not ill-appearing, toxic-appearing or diaphoretic.  Abdominal:     Palpations: Abdomen is soft.     Tenderness: There is no abdominal tenderness.  Genitourinary:    Comments: Cervix closed, thick, posterior. Small amount of dark red blood noted on exam glove.  Exam by Venia Carbon NP  Neurological:     Mental Status: She is alert.     MAU Course  Procedures  None  MDM  O positive blood type.  Reviewed [place] in detail with patient.   Assessment and Plan   A:  1. SAB (spontaneous abortion)   2. Vaginal bleeding during pregnancy   3. Type O blood, Rh positive     P:  Discharge home in stable condition F/u with OB next Message left with office to reach out to patient  next week Bleeding precautions Return to MAU if symptoms worsen  Lakeisa Heninger, Harolyn Rutherford, NP 09/10/2020 12:54 AM

## 2020-09-09 NOTE — MAU Note (Signed)
Was here last night.  Went home and passed some "tissue."  Brought it here with her today.  Having some bleeding today and passing clots smaller than pea sized.  No pain.

## 2020-09-10 DIAGNOSIS — O208 Other hemorrhage in early pregnancy: Secondary | ICD-10-CM | POA: Diagnosis not present

## 2020-09-10 DIAGNOSIS — O039 Complete or unspecified spontaneous abortion without complication: Secondary | ICD-10-CM | POA: Diagnosis not present

## 2020-09-10 DIAGNOSIS — Z3A00 Weeks of gestation of pregnancy not specified: Secondary | ICD-10-CM | POA: Diagnosis not present

## 2020-09-11 LAB — GC/CHLAMYDIA PROBE AMP (~~LOC~~) NOT AT ARMC
Chlamydia: NEGATIVE
Comment: NEGATIVE
Comment: NORMAL
Neisseria Gonorrhea: NEGATIVE

## 2020-09-13 DIAGNOSIS — F319 Bipolar disorder, unspecified: Secondary | ICD-10-CM | POA: Diagnosis not present

## 2020-09-14 DIAGNOSIS — O039 Complete or unspecified spontaneous abortion without complication: Secondary | ICD-10-CM | POA: Diagnosis not present

## 2020-09-18 DIAGNOSIS — F319 Bipolar disorder, unspecified: Secondary | ICD-10-CM | POA: Diagnosis not present

## 2020-09-25 ENCOUNTER — Other Ambulatory Visit: Payer: Self-pay

## 2020-09-25 ENCOUNTER — Ambulatory Visit
Admission: RE | Admit: 2020-09-25 | Discharge: 2020-09-25 | Disposition: A | Discharge status: Home / Self Care | Payer: Federal, State, Local not specified - PPO | Source: Ambulatory Visit | Attending: Emergency Medicine | Admitting: Emergency Medicine

## 2020-09-25 VITALS — BP 116/76 | HR 94 | Temp 98.0°F | Resp 18

## 2020-09-25 DIAGNOSIS — Z114 Encounter for screening for human immunodeficiency virus [HIV]: Secondary | ICD-10-CM | POA: Diagnosis not present

## 2020-09-25 DIAGNOSIS — Z113 Encounter for screening for infections with a predominantly sexual mode of transmission: Secondary | ICD-10-CM | POA: Diagnosis not present

## 2020-09-25 NOTE — Discharge Instructions (Signed)

## 2020-09-25 NOTE — ED Triage Notes (Signed)
PT states she is asymptomatic for an STD but would like a test. Pt is asymptomatic and no exposure for or to covid so no test done. Pt is aox4 and ambulatory.

## 2020-09-25 NOTE — ED Provider Notes (Signed)
[facility]    CSN: [id number] Arrival date & time: 09/25/20  1645      History   Chief Complaint Chief Complaint  Patient presents with  . SEXUALLY TRANSMITTED DISEASE    Asymptomatic    HPI Jordan Williams is a 24 y.o. female   Presenting for STD testing.  States she is currently sexually active with more than 1 partner: Both men and women.  Denies pelvic pain, vaginal discharge or irritation, change in urination or bowel habit, fever.  No known exposures.  Past Medical History:  Diagnosis Date  . Anxiety   . Bipolar 1 disorder (HCC)   . Depression   . Pre-diabetes     Patient Active Problem List   Diagnosis Date Noted  . Encounter for screening for COVID-19 08/01/2020    Past Surgical History:  Procedure Laterality Date  . NO PAST SURGERIES      OB History    Gravida  1   Para  0   Term  0   Preterm  0   AB  0   Living  0     SAB  0   TAB  0   Ectopic  0   Multiple  0   Live Births  0            Home Medications    Prior to Admission medications   Medication Sig Start Date End Date Taking? Authorizing Provider  folic acid (FOLVITE) 1 MG tablet Take 1 mg by mouth daily.    [provider]  lamoTRIgine (LAMICTAL) 25 MG tablet Take 50 mg by mouth at bedtime. 04/28/20   [provider]  Prenatal Vit-Fe Fumarate-FA (MULTIVITAMIN-PRENATAL) 27-0.8 MG TABS tablet Take 1 tablet by mouth daily at 12 noon.    [provider]  sertraline (ZOLOFT) 50 MG tablet Take 50 mg by mouth at bedtime. 04/28/20   [provider]    Family History Family History  Problem Relation Age of Onset  . Healthy Mother   . Healthy Father     Social History Social History   Tobacco Use  . Smoking status: Never Smoker  . Smokeless tobacco: Never Used  Vaping Use  . Vaping Use: Never used  Substance Use Topics  . Alcohol use: Yes    Comment: occ  . Drug use: Never     Allergies   Patient has no known  allergies.   Review of Systems As per HPI   Physical Exam Triage Vital Signs ED Triage Vitals  Enc Vitals Group     BP 09/25/20 1718 116/76     Pulse Rate 09/25/20 1718 94     Resp 09/25/20 1718 18     Temp 09/25/20 1718 98 F (36.7 C)     Temp Source 09/25/20 1718 Oral     SpO2 09/25/20 1718 97 %     Weight --      Height --      Head Circumference --      Peak Flow --      Pain Score 09/25/20 1745 0     Pain Loc --      Pain Edu? --      Excl. in GC? --    No data found.  Updated Vital Signs BP 116/76 (BP Location: Left Arm)   Pulse 94   Temp 98 F (36.7 C) (Oral)   Resp 18   LMP 06/23/2020   SpO2 97%   Visual  Acuity Right Eye Distance:   Left Eye Distance:   Bilateral Distance:    Right Eye Near:   Left Eye Near:    Bilateral Near:     Physical Exam Constitutional:      General: She is not in acute distress. HENT:     Head: Normocephalic and atraumatic.  Eyes:     General: No scleral icterus.    Pupils: Pupils are equal, round, and reactive to light.  Cardiovascular:     Rate and Rhythm: Normal rate.  Pulmonary:     Effort: Pulmonary effort is normal.  Abdominal:     General: Bowel sounds are normal.     Palpations: Abdomen is soft.     Tenderness: There is no abdominal tenderness. There is no right CVA tenderness, left CVA tenderness or guarding.  Genitourinary:    Comments: Patient declined, self-swab performed Skin:    Coloration: Skin is not jaundiced or pale.  Neurological:     Mental Status: She is alert and oriented to person, place, and time.      UC Treatments / Results  Labs (all labs ordered are listed, but only abnormal results are displayed) Labs Reviewed  HIV ANTIBODY (ROUTINE TESTING W REFLEX)  RPR  CERVICOVAGINAL ANCILLARY ONLY    EKG   Radiology No results found.  Procedures Procedures (including critical care time)  Medications Ordered in UC Medications - No data to display  Initial Impression /  Assessment and Plan / UC Course  I have reviewed the triage vital signs and the nursing notes.  Pertinent labs & imaging results that were available during my care of the patient were reviewed by me and considered in my medical decision making (see chart for details).     Appears well in office today.  No history of syphilis or HIV.  Provided contact information for ID follow-up if needed.  Cytology pending: We will treat if indicated.  Return precautions discussed, pt verbalized understanding and is agreeable to plan. Final Clinical Impressions(s) / UC Diagnoses   Final diagnoses:  Screening examination for venereal disease  Screening for human immunodeficiency virus     Discharge Instructions     Testing for chlamydia, gonorrhea, trichomonas is pending: please look for these results on the MyChart app/website.  We will notify you if you are positive and outline treatment at that time.  Important to avoid all forms of sexual intercourse (oral, vaginal, anal) with any/all partners for the next 7 days to avoid spreading/reinfecting. Any/all sexual partners should be notified of testing/treatment today.  Return for persistent/worsening symptoms or if you develop fever, abdominal or pelvic pain, discharge, genital pain, blood in your urine, or are re-exposed to an STI.    ED Prescriptions    None     PDMP not reviewed this encounter.   Hall-Potvin, [place], [place] 09/25/20 1806

## 2020-09-27 DIAGNOSIS — F319 Bipolar disorder, unspecified: Secondary | ICD-10-CM | POA: Diagnosis not present

## 2020-09-27 LAB — RPR: RPR Ser Ql: NONREACTIVE

## 2020-09-27 LAB — HIV ANTIBODY (ROUTINE TESTING W REFLEX): HIV Screen 4th Generation wRfx: NONREACTIVE

## 2020-09-29 ENCOUNTER — Telehealth (HOSPITAL_COMMUNITY): Payer: Self-pay | Admitting: Emergency Medicine

## 2020-09-29 LAB — CERVICOVAGINAL ANCILLARY ONLY
Chlamydia: NEGATIVE
Comment: NEGATIVE
Comment: NEGATIVE
Comment: NORMAL
Neisseria Gonorrhea: NEGATIVE
Trichomonas: POSITIVE — AB

## 2020-09-29 MED ORDER — METRONIDAZOLE 500 MG PO TABS: 2000.0000 mg | ORAL_TABLET | Freq: Once | ORAL | 0 refills | Status: AC

## 2020-10-10 ENCOUNTER — Encounter: Payer: Self-pay | Admitting: Emergency Medicine

## 2020-10-10 ENCOUNTER — Emergency Department
Admit: 2020-10-10 | Discharge status: Absent without leave | Payer: Federal, State, Local not specified - PPO | Source: Home / Self Care

## 2020-10-10 ENCOUNTER — Emergency Department (INDEPENDENT_AMBULATORY_CARE_PROVIDER_SITE_OTHER)
Admission: EM | Admit: 2020-10-10 | Discharge: 2020-10-10 | Disposition: A | Discharge status: Home / Self Care | Payer: Federal, State, Local not specified - PPO | Source: Home / Self Care

## 2020-10-10 ENCOUNTER — Other Ambulatory Visit (HOSPITAL_COMMUNITY)
Admission: RE | Admit: 2020-10-10 | Discharge: 2020-10-10 | Disposition: A | Discharge status: Home / Self Care | Payer: Federal, State, Local not specified - PPO | Source: Ambulatory Visit | Attending: Family Medicine | Admitting: Family Medicine

## 2020-10-10 ENCOUNTER — Other Ambulatory Visit: Payer: Self-pay

## 2020-10-10 DIAGNOSIS — Z3202 Encounter for pregnancy test, result negative: Secondary | ICD-10-CM

## 2020-10-10 DIAGNOSIS — E119 Type 2 diabetes mellitus without complications: Secondary | ICD-10-CM | POA: Insufficient documentation

## 2020-10-10 DIAGNOSIS — Z7251 High risk heterosexual behavior: Secondary | ICD-10-CM | POA: Diagnosis not present

## 2020-10-10 DIAGNOSIS — F419 Anxiety disorder, unspecified: Secondary | ICD-10-CM | POA: Diagnosis not present

## 2020-10-10 DIAGNOSIS — F319 Bipolar disorder, unspecified: Secondary | ICD-10-CM | POA: Insufficient documentation

## 2020-10-10 LAB — POCT URINE PREGNANCY: Preg Test, Ur: NEGATIVE

## 2020-10-10 NOTE — ED Provider Notes (Signed)
[facility]    CSN: [id number] Arrival date & time: 10/10/20  1342      History   Chief Complaint Chief Complaint  Patient presents with  . Possible Pregnancy    HPI Jordan Williams is a 24 y.o. female.   HPI Patient with a medical history significant for bipolar 1 disorder and high risk sexual activity presents today for possible pregnancy and screening for STIs.  Patient was seen on 09/25/2020 and tested positive for trichomonas.  She reports she is here today as she has a new sexual partner and is concerned for possible STIs and possible pregnancy.  She is unaware when her last menstrual period. Past Medical History:  Diagnosis Date  . Anxiety   . Bipolar 1 disorder (HCC)   . Depression   . Pre-diabetes     Patient Active Problem List   Diagnosis Date Noted  . Encounter for screening for COVID-19 08/01/2020    Past Surgical History:  Procedure Laterality Date  . NO PAST SURGERIES      OB History    Gravida  1   Para  0   Term  0   Preterm  0   AB  0   Living  0     SAB  0   TAB  0   Ectopic  0   Multiple  0   Live Births  0            Home Medications    Prior to Admission medications   Medication Sig Start Date End Date Taking? Authorizing Provider  lamoTRIgine (LAMICTAL) 25 MG tablet Take 50 mg by mouth at bedtime. 04/28/20   [provider]  sertraline (ZOLOFT) 50 MG tablet Take 50 mg by mouth at bedtime. 04/28/20   [provider]    Family History Family History  Problem Relation Age of Onset  . Healthy Mother   . Healthy Father     Social History Social History   Tobacco Use  . Smoking status: Never Smoker  . Smokeless tobacco: Never Used  Vaping Use  . Vaping Use: Never used  Substance Use Topics  . Alcohol use: Yes    Comment: occ  . Drug use: Never     Allergies   Patient has no known allergies.   Review of Systems Review of Systems Pertinent negatives listed in  HPI   Physical Exam Triage Vital Signs ED Triage Vitals  Enc Vitals Group     BP 10/10/20 1401 138/87     Pulse Rate 10/10/20 1401 81     Resp --      Temp 10/10/20 1401 98.9 F (37.2 C)     Temp Source 10/10/20 1401 Oral     SpO2 10/10/20 1401 100 %     Weight 10/10/20 1402 220 lb (99.8 kg)     Height 10/10/20 1402 5\' 4"  (1.626 m)     Head Circumference --      Peak Flow --      Pain Score 10/10/20 1402 0     Pain Loc --      Pain Edu? --      Excl. in GC? --    No data found.  Updated Vital Signs BP 138/87 (BP Location: Right Arm)   Pulse 81   Temp 98.9 F (37.2 C) (Oral)   Ht 5\' 4"  (1.626 m)   Wt 220 lb (99.8 kg)   LMP 09/10/2020 (Exact Date)   SpO2  100%   Breastfeeding No   BMI 37.76 kg/m   Visual Acuity Right Eye Distance:   Left Eye Distance:   Bilateral Distance:    Right Eye Near:   Left Eye Near:    Bilateral Near:     Physical Exam General appearance: alert, well developed,obese, no distress Head: Normocephalic, without obvious abnormality, atraumatic Respiratory: Respirations even and unlabored, normal respiratory rate Heart: rate and rhythm normal.  Extremities: No gross deformities Skin: Skin color, texture, turgor normal. No rashes seen  Psych: Flat affect   UC Treatments / Results  Labs (all labs ordered are listed, but only abnormal results are displayed) Labs Reviewed - No data to display  EKG   Radiology No results found.  Procedures Procedures (including critical care time)  Medications Ordered in UC Medications - No data to display  Initial Impression / Assessment and Plan / UC Course  I have reviewed the triage vital signs and the nursing notes.  Pertinent labs & imaging results that were available during my care of the patient were reviewed by me and considered in my medical decision making (see chart for details).    Educated patient on the importance of getting established with a gynecologist and continue the  relationship with her primary care provider for routine screenings. Patient has had multiple urgent care visits for vaginal related complaints encourage patient to get established at [facility] for continued care and routine screening of STIs and to discuss contraceptive planning if so desired. STI vaginal cytology pending. Patient advised that results will be available within 3 to 5 days. She is asymptomatic and has had no known exposure. No treatment prescribed today. Final Clinical Impressions(s) / UC Diagnoses   Final diagnoses:  Unprotected sex  Pregnancy examination or test, negative result     Discharge Instructions     Given your high risk sexual behavior it is highly recommended that she get established with gynecologist and primary care for routine STI screening. A gynecologist can address options for contraceptives and provider routine screening for STI. You are already established with a primary care provider at [facility], please schedule a follow-up from your recent hospital admission. Your STD results will be available within 3 to 5 days.  We will contact you if any treatment is required otherwise your results will be uploaded to MyChart.    ED Prescriptions    None     PDMP not reviewed this encounter.   Bing Neighbors, FNP 10/10/20 1440

## 2020-10-10 NOTE — ED Triage Notes (Signed)
Patient is here today for pregnancy test and STD testing as she had unprotected sex with new partner. She is asymptomatic.

## 2020-10-10 NOTE — Discharge Instructions (Addendum)
Given your high risk sexual behavior it is highly recommended that she get established with gynecologist and primary care for routine STI screening. A gynecologist can address options for contraceptives and provider routine screening for STI. You are already established with a primary care provider at [facility], please schedule a follow-up from your recent hospital admission. Your STD results will be available within 3 to 5 days.  We will contact you if any treatment is required otherwise your results will be uploaded to MyChart.

## 2020-10-11 LAB — CERVICOVAGINAL ANCILLARY ONLY
Bacterial Vaginitis (gardnerella): NEGATIVE
Candida Glabrata: NEGATIVE
Candida Vaginitis: NEGATIVE
Chlamydia: NEGATIVE
Comment: NEGATIVE
Comment: NEGATIVE
Comment: NEGATIVE
Comment: NEGATIVE
Comment: NEGATIVE
Comment: NORMAL
Neisseria Gonorrhea: NEGATIVE
Trichomonas: NEGATIVE

## 2020-10-17 DIAGNOSIS — F319 Bipolar disorder, unspecified: Secondary | ICD-10-CM | POA: Diagnosis not present

## 2020-11-01 DIAGNOSIS — F319 Bipolar disorder, unspecified: Secondary | ICD-10-CM | POA: Diagnosis not present

## 2020-11-09 DIAGNOSIS — R059 Cough, unspecified: Secondary | ICD-10-CM | POA: Diagnosis not present

## 2020-11-09 DIAGNOSIS — Z20822 Contact with and (suspected) exposure to covid-19: Secondary | ICD-10-CM | POA: Diagnosis not present

## 2020-11-09 DIAGNOSIS — Z7189 Other specified counseling: Secondary | ICD-10-CM | POA: Diagnosis not present

## 2020-11-09 DIAGNOSIS — R0981 Nasal congestion: Secondary | ICD-10-CM | POA: Diagnosis not present

## 2020-11-28 DIAGNOSIS — F319 Bipolar disorder, unspecified: Secondary | ICD-10-CM | POA: Diagnosis not present

## 2020-12-14 DIAGNOSIS — Z01419 Encounter for gynecological examination (general) (routine) without abnormal findings: Secondary | ICD-10-CM | POA: Diagnosis not present

## 2020-12-29 DIAGNOSIS — Z20828 Contact with and (suspected) exposure to other viral communicable diseases: Secondary | ICD-10-CM | POA: Diagnosis not present

## 2021-01-10 DIAGNOSIS — F319 Bipolar disorder, unspecified: Secondary | ICD-10-CM | POA: Diagnosis not present

## 2021-01-16 DIAGNOSIS — F319 Bipolar disorder, unspecified: Secondary | ICD-10-CM | POA: Diagnosis not present

## 2021-01-24 DIAGNOSIS — F319 Bipolar disorder, unspecified: Secondary | ICD-10-CM | POA: Diagnosis not present

## 2021-01-28 IMAGING — US US OB < 14 WEEKS - US OB TV
1 series · 15 of 28 positions shown · non-contrast
Comparison: None.

CLINICAL DATA: Vaginal bleeding.

EXAM:
OBSTETRIC <14 WK US AND TRANSVAGINAL OB US
TECHNIQUE: Transvaginal ultrasound was performed for complete evaluation of the
gestation as well as the maternal uterus, adnexal regions, and
pelvic cul-de-sac.

[Series 1: us ob < 14 weeks - us ob tv · 52 acquisitions, 15 frames shown]
[im 1/52]
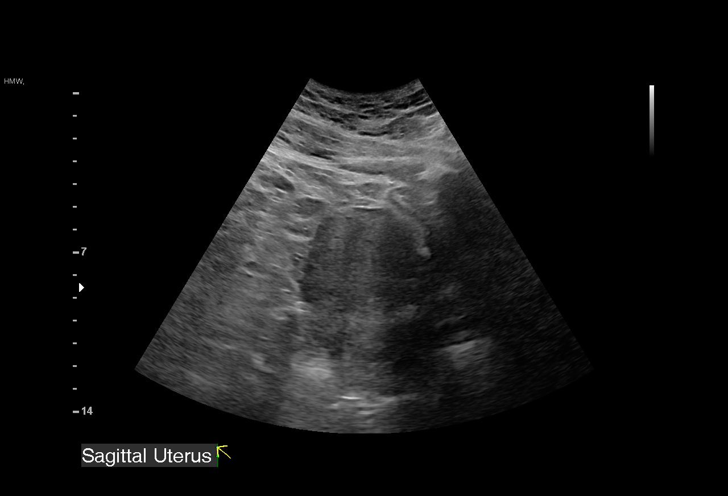
[im 4/52]
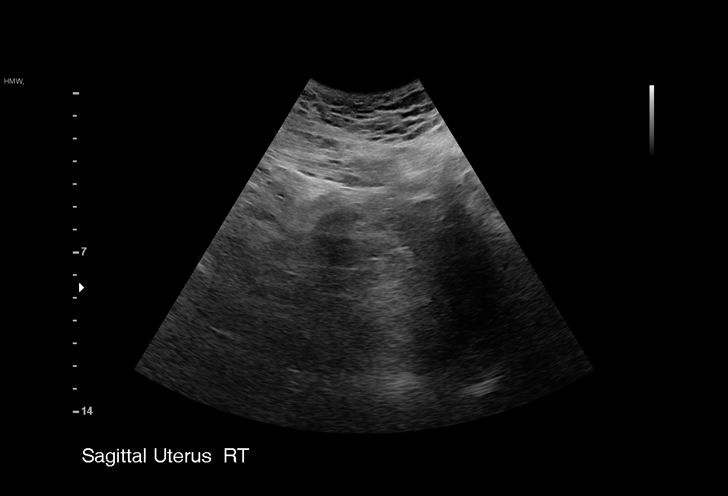
[im 8/52]
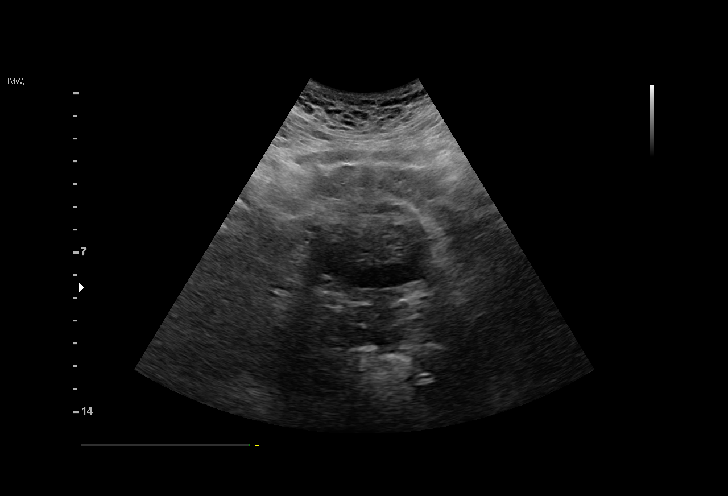
[im 12/52]
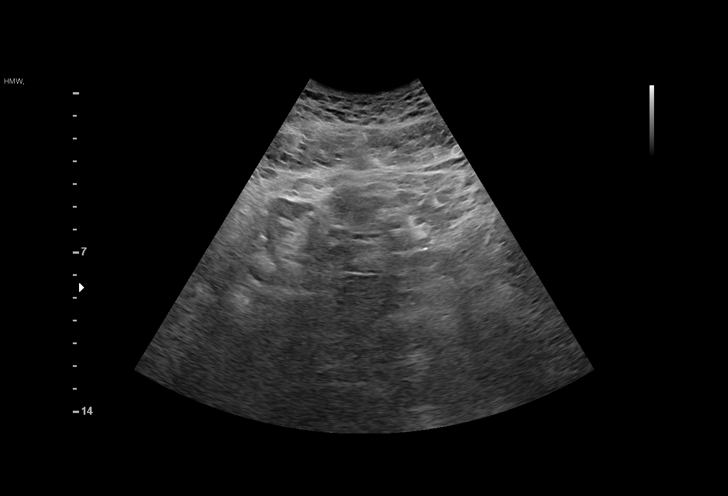
[im 16/52]
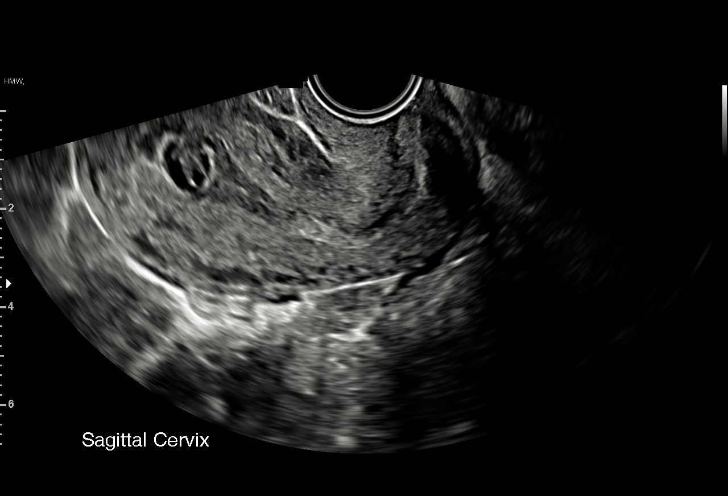
[im 19/52]
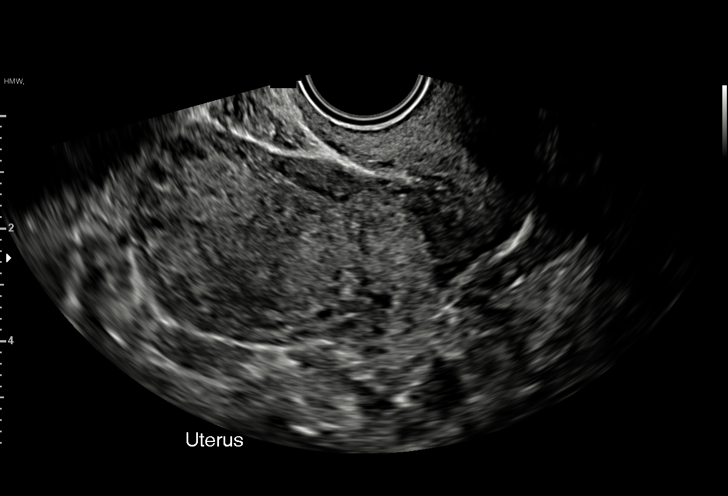
[im 23/52]
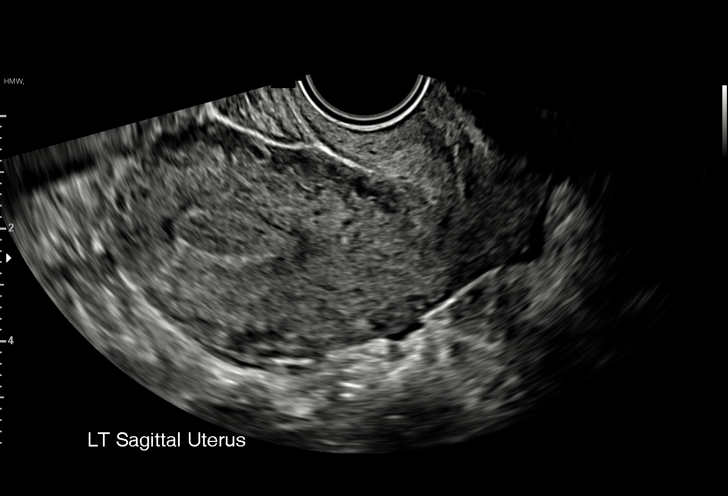
[im 27/52]
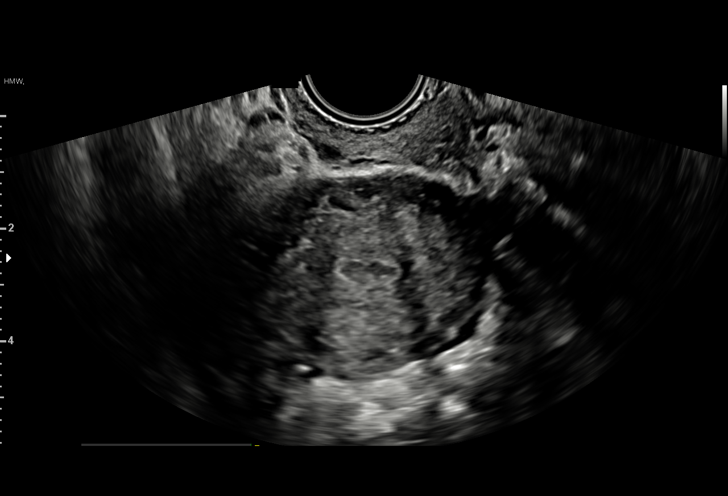
[im 29/52]
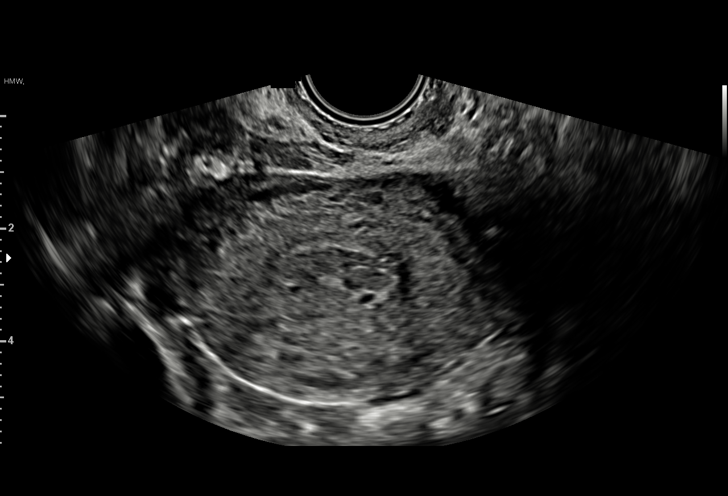
[im 33/52]
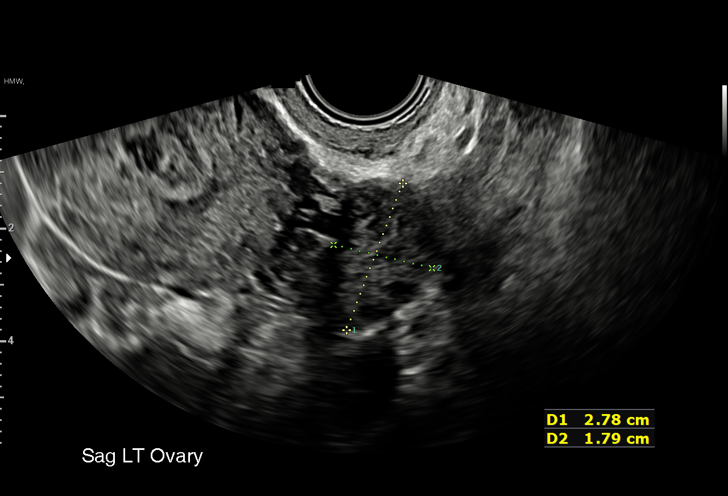
[im 36/52]
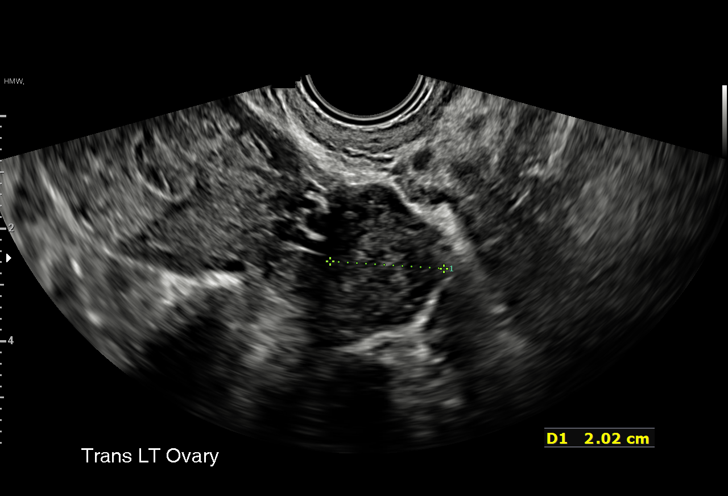
[im 40/52]
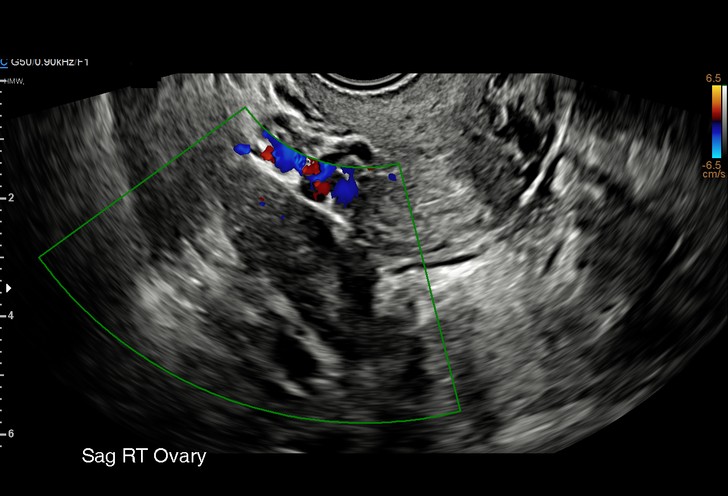
[im 44/52]
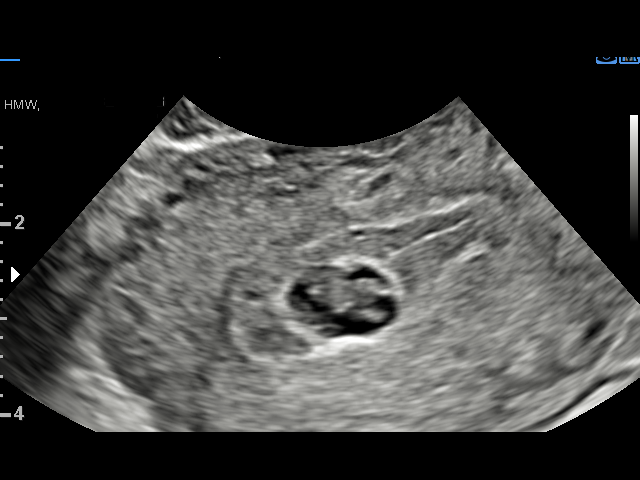
[im 48/52]
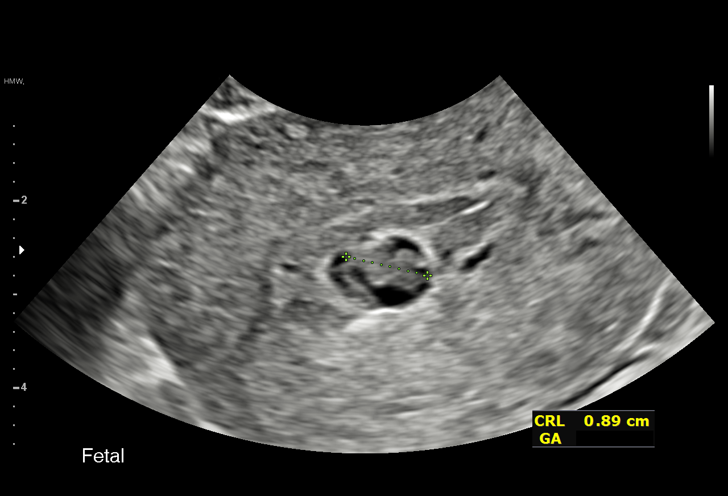
[im 52/52]
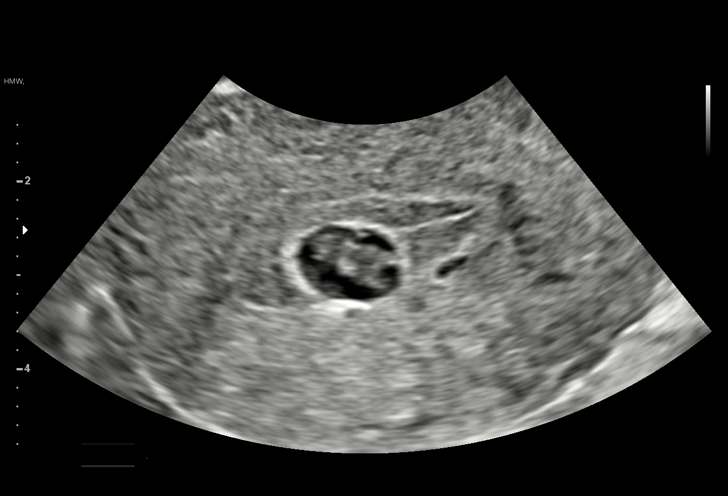

[15 of 28 positions shown; findings below may reference images not displayed]

FINDINGS: Intrauterine gestational sac: Single

Yolk sac:  Yes

Embryo:  Yes

Cardiac Activity: Yes

Heart Rate: 135 bpm

CRL:   0.89 cm 6 w 5 d                  US EDC: 04/30/2021

Maternal uterus/adnexae:

Subchorionic hemorrhage: None visualized

Right ovary: Normal

Left ovary: Normal

Other :None

Free fluid:  None
IMPRESSION: 1. Single living intrauterine gestation with estimated gestational
age of 6 weeks and 5 days.
2. No findings to explain patient's abnormal vaginal bleeding.

## 2021-02-08 DIAGNOSIS — F319 Bipolar disorder, unspecified: Secondary | ICD-10-CM | POA: Diagnosis not present

## 2021-02-10 DIAGNOSIS — N920 Excessive and frequent menstruation with regular cycle: Secondary | ICD-10-CM | POA: Diagnosis not present

## 2021-02-10 DIAGNOSIS — R319 Hematuria, unspecified: Secondary | ICD-10-CM | POA: Diagnosis not present

## 2021-02-10 DIAGNOSIS — Z113 Encounter for screening for infections with a predominantly sexual mode of transmission: Secondary | ICD-10-CM | POA: Diagnosis not present

## 2021-02-15 DIAGNOSIS — F319 Bipolar disorder, unspecified: Secondary | ICD-10-CM | POA: Diagnosis not present

## 2021-02-23 DIAGNOSIS — F319 Bipolar disorder, unspecified: Secondary | ICD-10-CM | POA: Diagnosis not present

## 2021-03-05 DIAGNOSIS — F319 Bipolar disorder, unspecified: Secondary | ICD-10-CM | POA: Diagnosis not present

## 2021-03-16 DIAGNOSIS — Z6841 Body Mass Index (BMI) 40.0 and over, adult: Secondary | ICD-10-CM | POA: Diagnosis not present

## 2021-03-16 DIAGNOSIS — N926 Irregular menstruation, unspecified: Secondary | ICD-10-CM | POA: Diagnosis not present

## 2021-03-16 DIAGNOSIS — Z113 Encounter for screening for infections with a predominantly sexual mode of transmission: Secondary | ICD-10-CM | POA: Diagnosis not present

## 2021-03-19 DIAGNOSIS — N926 Irregular menstruation, unspecified: Secondary | ICD-10-CM | POA: Diagnosis not present

## 2021-03-19 DIAGNOSIS — Z113 Encounter for screening for infections with a predominantly sexual mode of transmission: Secondary | ICD-10-CM | POA: Diagnosis not present

## 2021-03-21 DIAGNOSIS — F319 Bipolar disorder, unspecified: Secondary | ICD-10-CM | POA: Diagnosis not present

## 2021-05-06 DIAGNOSIS — N926 Irregular menstruation, unspecified: Secondary | ICD-10-CM | POA: Diagnosis not present

## 2021-05-08 DIAGNOSIS — F319 Bipolar disorder, unspecified: Secondary | ICD-10-CM | POA: Diagnosis not present

## 2021-05-24 DIAGNOSIS — E282 Polycystic ovarian syndrome: Secondary | ICD-10-CM | POA: Diagnosis not present

## 2021-05-24 DIAGNOSIS — E1165 Type 2 diabetes mellitus with hyperglycemia: Secondary | ICD-10-CM | POA: Diagnosis not present

## 2021-05-24 DIAGNOSIS — F319 Bipolar disorder, unspecified: Secondary | ICD-10-CM | POA: Diagnosis not present

## 2021-05-24 DIAGNOSIS — N926 Irregular menstruation, unspecified: Secondary | ICD-10-CM | POA: Diagnosis not present

## 2021-06-07 DIAGNOSIS — Z6841 Body Mass Index (BMI) 40.0 and over, adult: Secondary | ICD-10-CM | POA: Diagnosis not present

## 2021-06-07 DIAGNOSIS — E282 Polycystic ovarian syndrome: Secondary | ICD-10-CM | POA: Diagnosis not present

## 2021-06-07 DIAGNOSIS — F319 Bipolar disorder, unspecified: Secondary | ICD-10-CM | POA: Diagnosis not present

## 2021-06-07 DIAGNOSIS — O039 Complete or unspecified spontaneous abortion without complication: Secondary | ICD-10-CM | POA: Diagnosis not present

## 2021-06-07 DIAGNOSIS — E1165 Type 2 diabetes mellitus with hyperglycemia: Secondary | ICD-10-CM | POA: Diagnosis not present

## 2021-06-09 DIAGNOSIS — Z113 Encounter for screening for infections with a predominantly sexual mode of transmission: Secondary | ICD-10-CM | POA: Diagnosis not present

## 2021-06-09 DIAGNOSIS — R350 Frequency of micturition: Secondary | ICD-10-CM | POA: Diagnosis not present

## 2021-06-09 DIAGNOSIS — Q383 Other congenital malformations of tongue: Secondary | ICD-10-CM | POA: Diagnosis not present

## 2021-06-09 DIAGNOSIS — E1169 Type 2 diabetes mellitus with other specified complication: Secondary | ICD-10-CM | POA: Diagnosis not present

## 2021-06-19 DIAGNOSIS — F319 Bipolar disorder, unspecified: Secondary | ICD-10-CM | POA: Diagnosis not present

## 2021-07-02 DIAGNOSIS — A5901 Trichomonal vulvovaginitis: Secondary | ICD-10-CM | POA: Diagnosis not present

## 2021-07-02 DIAGNOSIS — Z3A01 Less than 8 weeks gestation of pregnancy: Secondary | ICD-10-CM | POA: Diagnosis not present

## 2021-07-02 DIAGNOSIS — O23591 Infection of other part of genital tract in pregnancy, first trimester: Secondary | ICD-10-CM | POA: Diagnosis not present

## 2021-07-02 DIAGNOSIS — O26891 Other specified pregnancy related conditions, first trimester: Secondary | ICD-10-CM | POA: Diagnosis not present

## 2021-07-02 DIAGNOSIS — O24111 Pre-existing diabetes mellitus, type 2, in pregnancy, first trimester: Secondary | ICD-10-CM | POA: Diagnosis not present

## 2021-07-02 DIAGNOSIS — O219 Vomiting of pregnancy, unspecified: Secondary | ICD-10-CM | POA: Diagnosis not present

## 2021-07-02 DIAGNOSIS — Z79899 Other long term (current) drug therapy: Secondary | ICD-10-CM | POA: Diagnosis not present

## 2021-07-02 DIAGNOSIS — Z7984 Long term (current) use of oral hypoglycemic drugs: Secondary | ICD-10-CM | POA: Diagnosis not present

## 2021-07-02 DIAGNOSIS — A599 Trichomoniasis, unspecified: Secondary | ICD-10-CM | POA: Diagnosis not present

## 2021-07-02 DIAGNOSIS — R102 Pelvic and perineal pain: Secondary | ICD-10-CM | POA: Diagnosis not present

## 2021-07-02 DIAGNOSIS — O98311 Other infections with a predominantly sexual mode of transmission complicating pregnancy, first trimester: Secondary | ICD-10-CM | POA: Diagnosis not present

## 2021-07-02 DIAGNOSIS — R1030 Lower abdominal pain, unspecified: Secondary | ICD-10-CM | POA: Diagnosis not present

## 2021-07-05 DIAGNOSIS — E1165 Type 2 diabetes mellitus with hyperglycemia: Secondary | ICD-10-CM | POA: Diagnosis not present

## 2021-07-05 DIAGNOSIS — E282 Polycystic ovarian syndrome: Secondary | ICD-10-CM | POA: Diagnosis not present

## 2021-07-11 ENCOUNTER — Other Ambulatory Visit: Payer: Self-pay

## 2021-07-11 ENCOUNTER — Inpatient Hospital Stay (HOSPITAL_COMMUNITY)
Admission: AD | Admit: 2021-07-11 | Discharge: 2021-07-11 | Disposition: A | Discharge status: Home / Self Care | Payer: Federal, State, Local not specified - PPO | Attending: Obstetrics & Gynecology | Admitting: Obstetrics & Gynecology

## 2021-07-11 ENCOUNTER — Encounter (HOSPITAL_COMMUNITY): Payer: Self-pay | Admitting: Obstetrics & Gynecology

## 2021-07-11 ENCOUNTER — Inpatient Hospital Stay (HOSPITAL_COMMUNITY): Payer: Federal, State, Local not specified - PPO

## 2021-07-11 DIAGNOSIS — N96 Recurrent pregnancy loss: Secondary | ICD-10-CM | POA: Diagnosis not present

## 2021-07-11 DIAGNOSIS — O4691 Antepartum hemorrhage, unspecified, first trimester: Secondary | ICD-10-CM

## 2021-07-11 DIAGNOSIS — R748 Abnormal levels of other serum enzymes: Secondary | ICD-10-CM | POA: Diagnosis not present

## 2021-07-11 DIAGNOSIS — O99891 Other specified diseases and conditions complicating pregnancy: Secondary | ICD-10-CM

## 2021-07-11 DIAGNOSIS — N939 Abnormal uterine and vaginal bleeding, unspecified: Secondary | ICD-10-CM | POA: Diagnosis not present

## 2021-07-11 DIAGNOSIS — O209 Hemorrhage in early pregnancy, unspecified: Secondary | ICD-10-CM | POA: Diagnosis not present

## 2021-07-11 DIAGNOSIS — Z79899 Other long term (current) drug therapy: Secondary | ICD-10-CM | POA: Diagnosis not present

## 2021-07-11 DIAGNOSIS — R7401 Elevation of levels of liver transaminase levels: Secondary | ICD-10-CM | POA: Diagnosis not present

## 2021-07-11 DIAGNOSIS — O26851 Spotting complicating pregnancy, first trimester: Secondary | ICD-10-CM | POA: Insufficient documentation

## 2021-07-11 DIAGNOSIS — Z3A01 Less than 8 weeks gestation of pregnancy: Secondary | ICD-10-CM | POA: Insufficient documentation

## 2021-07-11 DIAGNOSIS — O4690 Antepartum hemorrhage, unspecified, unspecified trimester: Secondary | ICD-10-CM

## 2021-07-11 DIAGNOSIS — N8312 Corpus luteum cyst of left ovary: Secondary | ICD-10-CM | POA: Diagnosis not present

## 2021-07-11 LAB — COMPREHENSIVE METABOLIC PANEL
ALT: 57 U/L — ABNORMAL HIGH (ref 0–44)
AST: 34 U/L (ref 15–41)
Albumin: 4 g/dL (ref 3.5–5.0)
Alkaline Phosphatase: 41 U/L (ref 38–126)
Anion gap: 7 (ref 5–15)
BUN: 6 mg/dL (ref 6–20)
CO2: 23 mmol/L (ref 22–32)
Calcium: 9.3 mg/dL (ref 8.9–10.3)
Chloride: 108 mmol/L (ref 98–111)
Creatinine, Ser: 0.83 mg/dL (ref 0.44–1.00)
GFR, Estimated: >60 mL/min (ref >60)
Glucose, Bld: 110 mg/dL — ABNORMAL HIGH (ref 70–99)
Potassium: 3.6 mmol/L (ref 3.5–5.1)
Sodium: 138 mmol/L (ref 135–145)
Total Bilirubin: 0.7 mg/dL (ref 0.3–1.2)
Total Protein: 6.7 g/dL (ref 6.5–8.1)

## 2021-07-11 LAB — CBC WITH DIFFERENTIAL/PLATELET
Abs Immature Granulocytes: 0.02 10*3/uL (ref 0.00–0.07)
Basophils Absolute: 0.1 10*3/uL (ref 0.0–0.1)
Basophils Relative: 1 %
Eosinophils Absolute: 0.2 10*3/uL (ref 0.0–0.5)
Eosinophils Relative: 2 %
HCT: 41.5 % (ref 36.0–46.0)
Hemoglobin: 14.2 g/dL (ref 12.0–15.0)
Immature Granulocytes: 0 %
Lymphocytes Relative: 38 %
Lymphs Abs: 2.9 10*3/uL (ref 0.7–4.0)
MCH: 30.6 pg (ref 26.0–34.0)
MCHC: 34.2 g/dL (ref 30.0–36.0)
MCV: 89.4 fL (ref 80.0–100.0)
Monocytes Absolute: 0.4 10*3/uL (ref 0.1–1.0)
Monocytes Relative: 5 %
Neutro Abs: 4 10*3/uL (ref 1.7–7.7)
Neutrophils Relative %: 54 %
Platelets: 288 10*3/uL (ref 150–400)
RBC: 4.64 MIL/uL (ref 3.87–5.11)
RDW: 13.5 % (ref 11.5–15.5)
WBC: 7.5 10*3/uL (ref 4.0–10.5)
nRBC: 0 % (ref 0.0–0.2)

## 2021-07-11 LAB — URINALYSIS, ROUTINE W REFLEX MICROSCOPIC
Bacteria, UA: NONE SEEN
Bilirubin Urine: NEGATIVE
Glucose, UA: NEGATIVE mg/dL
Hgb urine dipstick: NEGATIVE
Ketones, ur: 80 mg/dL — AB
Nitrite: NEGATIVE
Protein, ur: NEGATIVE mg/dL
Specific Gravity, Urine: 1.028 (ref 1.005–1.030)
pH: 5 (ref 5.0–8.0)

## 2021-07-11 LAB — HCG, QUANTITATIVE, PREGNANCY: hCG, Beta Chain, Quant, S: 37520 m[IU]/mL — ABNORMAL HIGH (ref <5)

## 2021-07-11 NOTE — MAU Provider Note (Addendum)
History   CSN: [id number]  Arrival date and time: 07/11/21 1303   Event Date/Time   First Provider Initiated Contact with Patient 07/11/21 1501      Chief Complaint  Patient presents with   Spotting   HPI  25 yo G2P0010 at [redacted]w[redacted]d by LMP presents to the MAU for vaginal bleeding since this morning. The bleeding was initially light [place] but became pink throughout the day. She notices the bleeding mostly when she wipes but also had a small amount on her underwear and bed sheets. She had a trichomonas infection and a UTI 1 week ago which she received treatment for. She has had 1 prior spontaneous abortion in 2021 which she states presented similarly. She has had 2 episodes of vomiting accompanied by post-prandial nausea over the past week. She denies any fever, abdominal pain, abnormal vaginal discharge, or dysuria. She has not had sexual intercourse for the past few days.   OB History     Gravida  2   Para  0   Term  0   Preterm  0   AB  1   Living  0      SAB  1   IAB  0   Ectopic  0   Multiple  1   Live Births  0           Past Medical History:  Diagnosis Date   Anxiety    Bipolar 1 disorder (HCC)    Depression    Pre-diabetes     Past Surgical History:  Procedure Laterality Date   NO PAST SURGERIES      Family History  Problem Relation Age of Onset   Healthy Mother    Healthy Father     Social History   Tobacco Use   Smoking status: Never   Smokeless tobacco: Never  Vaping Use   Vaping Use: Never used  Substance Use Topics   Alcohol use: Yes    Comment: occ   Drug use: Never    Allergies: No Known Allergies  Medications Prior to Admission  Medication Sig Dispense Refill Last Dose   lamoTRIgine (LAMICTAL) 25 MG tablet Take 50 mg by mouth at bedtime.   07/10/2021   metFORMIN (GLUCOPHAGE) 1000 MG tablet Take 1,000 mg by mouth 2 (two) times daily with a meal.   07/10/2021   Prenatal Vit-Fe Fumarate-FA (PRENATAL MULTIVITAMIN) TABS tablet  Take 1 tablet by mouth daily at 12 noon.   07/10/2021   sertraline (ZOLOFT) 50 MG tablet Take 50 mg by mouth at bedtime.       Review of Systems  Constitutional:  Negative for fever.  Gastrointestinal:  Positive for nausea and vomiting. Negative for abdominal pain, constipation and diarrhea.  Genitourinary:  Positive for vaginal bleeding. Negative for decreased urine volume, difficulty urinating, dysuria, flank pain, pelvic pain and vaginal discharge.  Physical Exam   Blood pressure 133/77, pulse (!) 103, temperature 98.2 F (36.8 C), temperature source Oral, resp. rate 20, height 5\' 3"  (1.6 m), weight 104.7 kg, last menstrual period 05/29/2021, SpO2 100 %, unknown if currently breastfeeding.  Physical Exam Exam conducted with a chaperone present.  Constitutional:      General: She is not in acute distress.    Appearance: Normal appearance.  HENT:     Head: Normocephalic and atraumatic.  Eyes:     General: No scleral icterus. Cardiovascular:     Rate and Rhythm: Normal rate.     Pulses: Normal pulses.  Heart sounds: Normal heart sounds.  Pulmonary:     Effort: Pulmonary effort is normal.     Breath sounds: Normal breath sounds.  Abdominal:     General: Bowel sounds are normal.     Palpations: Abdomen is soft.     Tenderness: There is no abdominal tenderness.  Genitourinary:    Pubic Area: No rash.      Vagina: Bleeding (minimal dark Coad blood in the vaginal vault removed with 2 swabs) present.     Cervix: Normal.     Uterus: Not tender.   Neurological:     Mental Status: She is alert.    MAU Course  Procedures US OB less than 14 weeks with OB transvaginal:  IMPRESSION: Single live intrauterine gestation of 6 weeks 1 day.  MDM - Basic workup for ectopic rule out performed.  hCG, quantitative - 37,520 CMP - slightly elevated ALT at 57, all other results WNL  CBC - WNL  UA - trace leukocytes, no nitrite  Assessment and Plan  Intrauterine pregnancy -  Transvaginal US confirmed single live intrauterine gestation, b-hcg at 43,520  - Patient has initial prenatal appointment scheduled with her OB on 7/29, encouraged her to follow up with a repeat US with her OB to continue to monitor   Vaginal bleeding  - Discussed this can be normal in early pregnancy - Cervical os was closed on exam and no active bleeding was observed   [redacted] weeks gestation of pregnancy   Elevated liver enzyme - Informed patient ALT was minimally elevated - This is not concerning at this time given no other findings/symptoms, told patient it will be repeated during pregnancy to ensure it has returned WNL    Athena Masse 07/11/2021, 3:16 PM   Attestation of Supervision of Student:  I confirm that I have verified the information documented in the physician assistant student's note and that I have also personally  reviewed and assisted with documentation and performance of   the history, physical exam and all medical decision making activities.  I have verified that all services and findings are accurately documented in this student's note; and I agree with management and plan as outlined in the documentation. I have also made any necessary editorial changes.   Cherre Robins, CNM [facility] [employer], [facility] 07/11/2021 5:03 PM

## 2021-07-11 NOTE — MAU Note (Signed)
Presents stating began spotting this morning.  Reports initially [place] in color now it's pink.  Denies abdominal pain or cramping.  LMP 05/31/2021.

## 2021-07-12 DIAGNOSIS — F319 Bipolar disorder, unspecified: Secondary | ICD-10-CM | POA: Diagnosis not present

## 2021-07-18 DIAGNOSIS — E1165 Type 2 diabetes mellitus with hyperglycemia: Secondary | ICD-10-CM | POA: Diagnosis not present

## 2021-07-18 DIAGNOSIS — Z3A01 Less than 8 weeks gestation of pregnancy: Secondary | ICD-10-CM | POA: Diagnosis not present

## 2021-07-18 DIAGNOSIS — O039 Complete or unspecified spontaneous abortion without complication: Secondary | ICD-10-CM | POA: Diagnosis not present

## 2021-07-18 DIAGNOSIS — E282 Polycystic ovarian syndrome: Secondary | ICD-10-CM | POA: Diagnosis not present

## 2021-07-20 DIAGNOSIS — F319 Bipolar disorder, unspecified: Secondary | ICD-10-CM | POA: Diagnosis not present

## 2021-07-20 DIAGNOSIS — O24119 Pre-existing diabetes mellitus, type 2, in pregnancy, unspecified trimester: Secondary | ICD-10-CM | POA: Diagnosis not present

## 2021-07-20 DIAGNOSIS — N911 Secondary amenorrhea: Secondary | ICD-10-CM | POA: Diagnosis not present

## 2021-07-20 DIAGNOSIS — E119 Type 2 diabetes mellitus without complications: Secondary | ICD-10-CM | POA: Diagnosis not present

## 2021-08-01 DIAGNOSIS — Z3481 Encounter for supervision of other normal pregnancy, first trimester: Secondary | ICD-10-CM | POA: Diagnosis not present

## 2021-08-01 DIAGNOSIS — Z3685 Encounter for antenatal screening for Streptococcus B: Secondary | ICD-10-CM | POA: Diagnosis not present

## 2021-08-01 LAB — OB RESULTS CONSOLE HIV ANTIBODY (ROUTINE TESTING): HIV: NONREACTIVE

## 2021-08-01 LAB — OB RESULTS CONSOLE RUBELLA ANTIBODY, IGM: Rubella: IMMUNE

## 2021-08-01 LAB — OB RESULTS CONSOLE ANTIBODY SCREEN: Antibody Screen: NEGATIVE

## 2021-08-01 LAB — HEPATITIS C ANTIBODY: HCV Ab: NEGATIVE

## 2021-08-01 LAB — OB RESULTS CONSOLE ABO/RH: RH Type: POSITIVE

## 2021-08-01 LAB — OB RESULTS CONSOLE GC/CHLAMYDIA
Chlamydia: NEGATIVE
Gonorrhea: NEGATIVE

## 2021-08-01 LAB — OB RESULTS CONSOLE HEPATITIS B SURFACE ANTIGEN: Hepatitis B Surface Ag: NEGATIVE

## 2021-08-01 LAB — OB RESULTS CONSOLE RPR: RPR: NONREACTIVE

## 2021-08-08 DIAGNOSIS — F319 Bipolar disorder, unspecified: Secondary | ICD-10-CM | POA: Diagnosis not present

## 2021-08-13 DIAGNOSIS — Z3A10 10 weeks gestation of pregnancy: Secondary | ICD-10-CM | POA: Diagnosis not present

## 2021-08-13 DIAGNOSIS — Z113 Encounter for screening for infections with a predominantly sexual mode of transmission: Secondary | ICD-10-CM | POA: Diagnosis not present

## 2021-08-13 DIAGNOSIS — Z3401 Encounter for supervision of normal first pregnancy, first trimester: Secondary | ICD-10-CM | POA: Diagnosis not present

## 2021-08-29 DIAGNOSIS — Z3A12 12 weeks gestation of pregnancy: Secondary | ICD-10-CM | POA: Diagnosis not present

## 2021-08-29 DIAGNOSIS — F319 Bipolar disorder, unspecified: Secondary | ICD-10-CM | POA: Diagnosis not present

## 2021-08-29 DIAGNOSIS — Z3682 Encounter for antenatal screening for nuchal translucency: Secondary | ICD-10-CM | POA: Diagnosis not present

## 2021-09-04 ENCOUNTER — Ambulatory Visit: Payer: Self-pay

## 2021-09-10 DIAGNOSIS — N760 Acute vaginitis: Secondary | ICD-10-CM | POA: Diagnosis not present

## 2021-09-13 DIAGNOSIS — F319 Bipolar disorder, unspecified: Secondary | ICD-10-CM | POA: Diagnosis not present

## 2021-09-20 DIAGNOSIS — F319 Bipolar disorder, unspecified: Secondary | ICD-10-CM | POA: Diagnosis not present

## 2021-10-02 DIAGNOSIS — F319 Bipolar disorder, unspecified: Secondary | ICD-10-CM | POA: Diagnosis not present

## 2021-10-10 DIAGNOSIS — F319 Bipolar disorder, unspecified: Secondary | ICD-10-CM | POA: Diagnosis not present

## 2021-10-15 DIAGNOSIS — Z3A19 19 weeks gestation of pregnancy: Secondary | ICD-10-CM | POA: Diagnosis not present

## 2021-10-15 DIAGNOSIS — Z363 Encounter for antenatal screening for malformations: Secondary | ICD-10-CM | POA: Diagnosis not present

## 2021-10-15 DIAGNOSIS — Z23 Encounter for immunization: Secondary | ICD-10-CM | POA: Diagnosis not present

## 2021-10-17 DIAGNOSIS — F319 Bipolar disorder, unspecified: Secondary | ICD-10-CM | POA: Diagnosis not present

## 2021-10-25 DIAGNOSIS — F319 Bipolar disorder, unspecified: Secondary | ICD-10-CM | POA: Diagnosis not present

## 2021-10-31 DIAGNOSIS — F319 Bipolar disorder, unspecified: Secondary | ICD-10-CM | POA: Diagnosis not present

## 2021-11-12 DIAGNOSIS — Z362 Encounter for other antenatal screening follow-up: Secondary | ICD-10-CM | POA: Diagnosis not present

## 2021-11-12 DIAGNOSIS — Z3A23 23 weeks gestation of pregnancy: Secondary | ICD-10-CM | POA: Diagnosis not present

## 2021-11-21 DIAGNOSIS — F319 Bipolar disorder, unspecified: Secondary | ICD-10-CM | POA: Diagnosis not present

## 2021-11-29 IMAGING — US US OB < 14 WEEKS - US OB TV
1 series · 15 of 28 positions shown · non-contrast
Comparison: None.

CLINICAL DATA: Vaginal bleeding.

EXAM:
OBSTETRIC <14 WK US AND TRANSVAGINAL OB US
TECHNIQUE: Both transabdominal and transvaginal ultrasound examinations were
performed for complete evaluation of the gestation as well as the
maternal uterus, adnexal regions, and pelvic cul-de-sac.
Transvaginal technique was performed to assess early pregnancy.

[Series 1: us ob < 14 weeks - us ob tv · 15 of 72 slices shown]
[im 1/72]
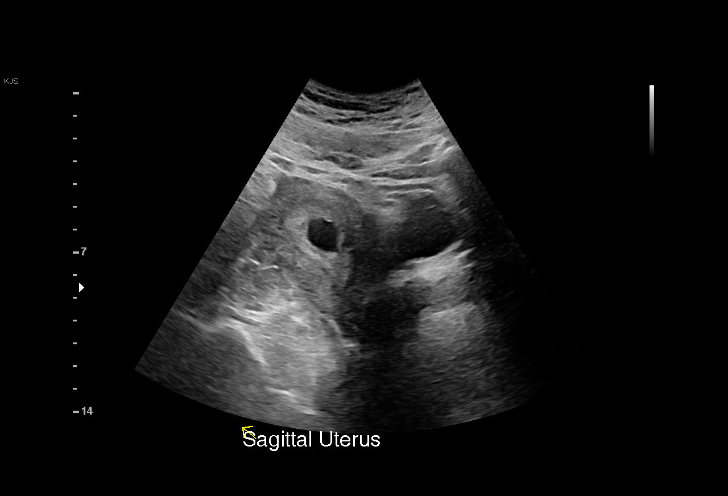
[im 6/72]
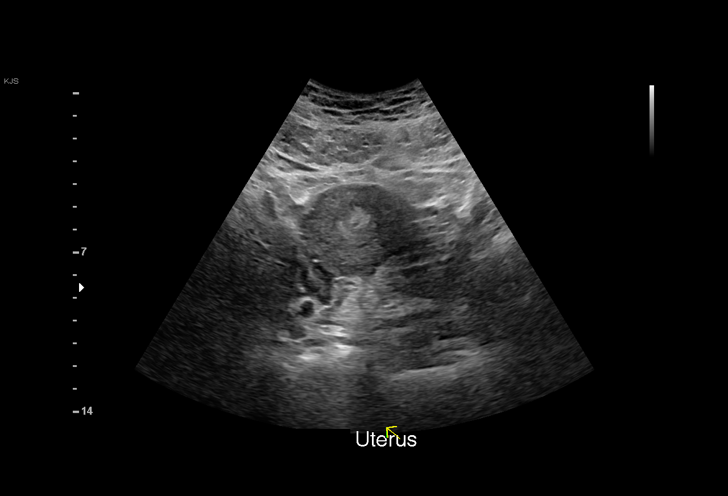
[im 11/72]
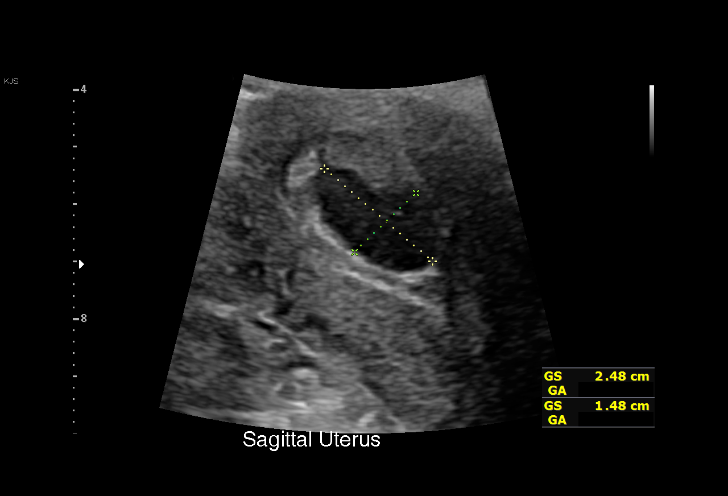
[im 16/72]
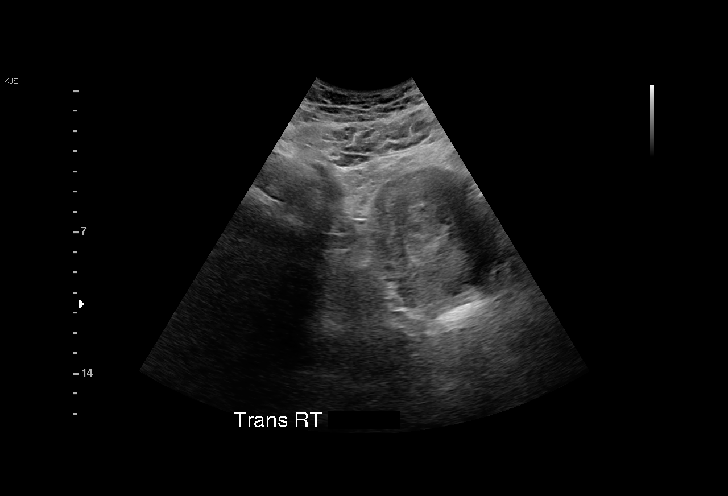
[im 22/72]
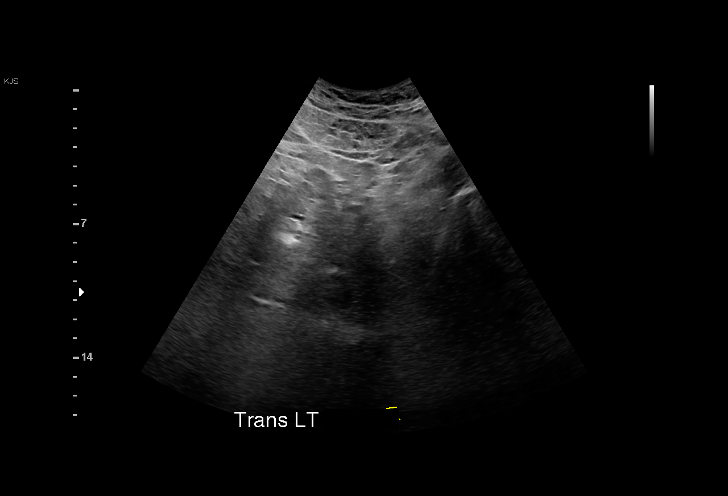
[im 27/72]
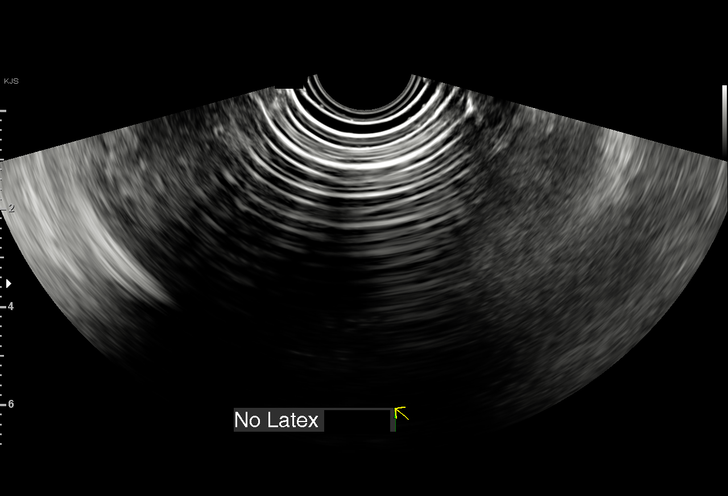
[im 32/72]
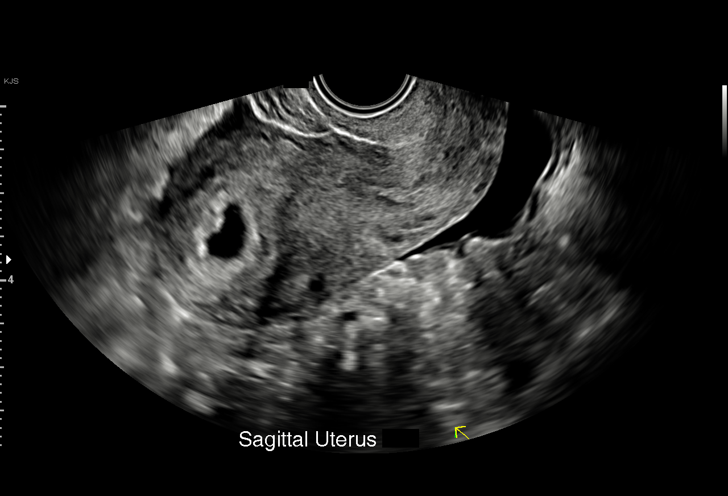
[im 37/72]
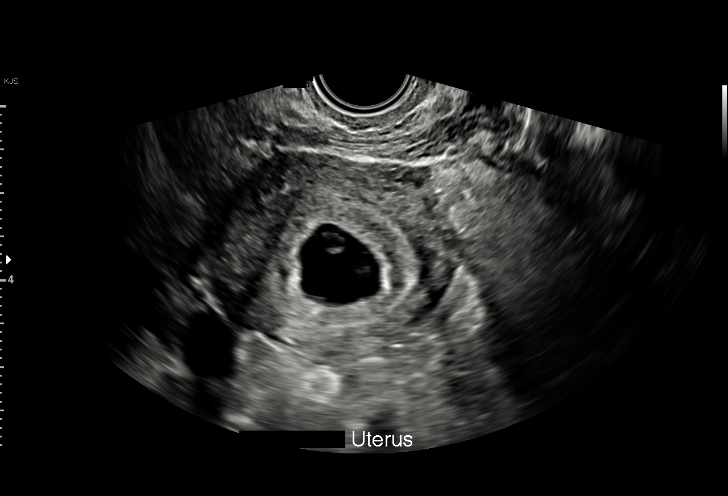
[im 40/72]
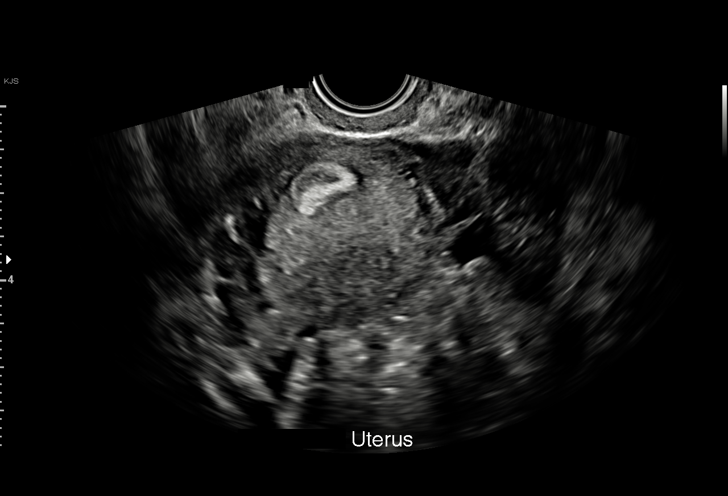
[im 45/72]
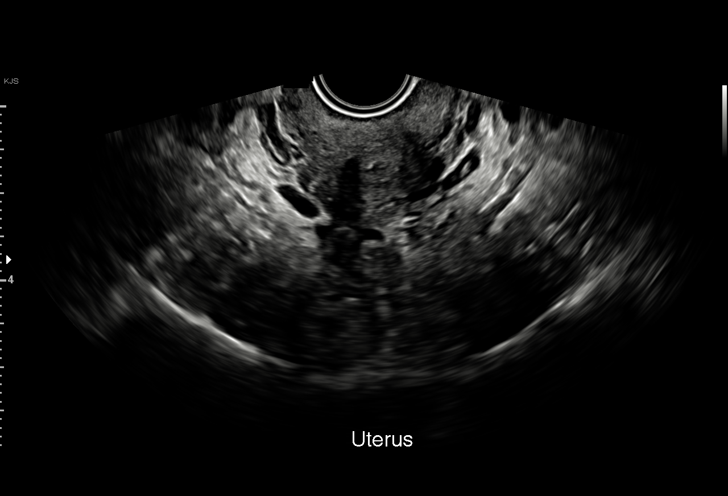
[im 50/72]
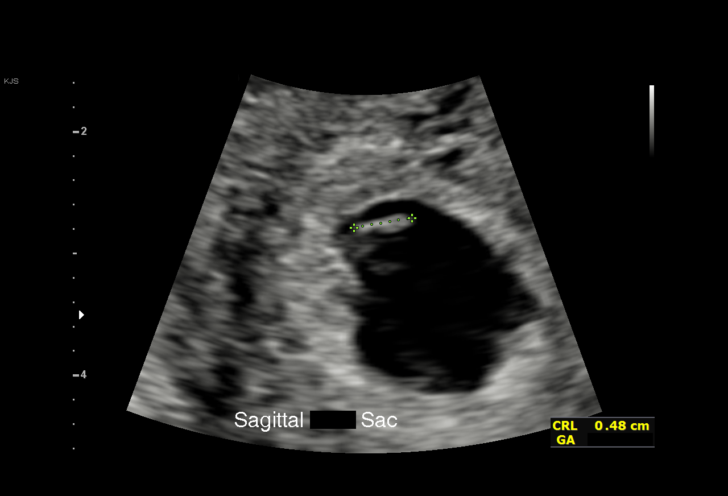
[im 56/72]
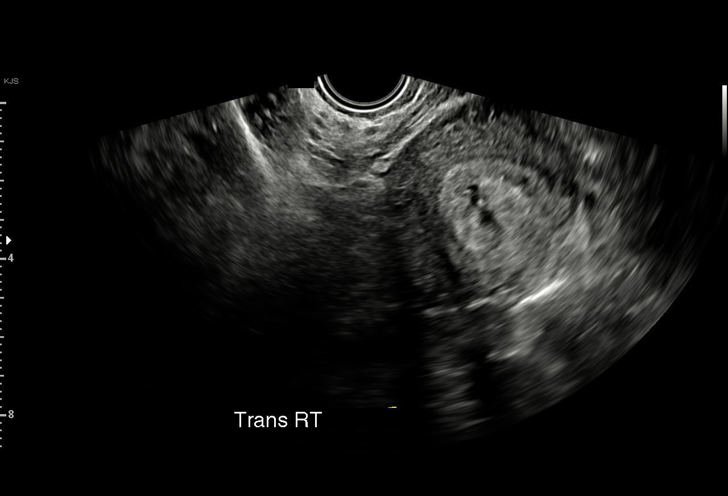
[im 61/72]
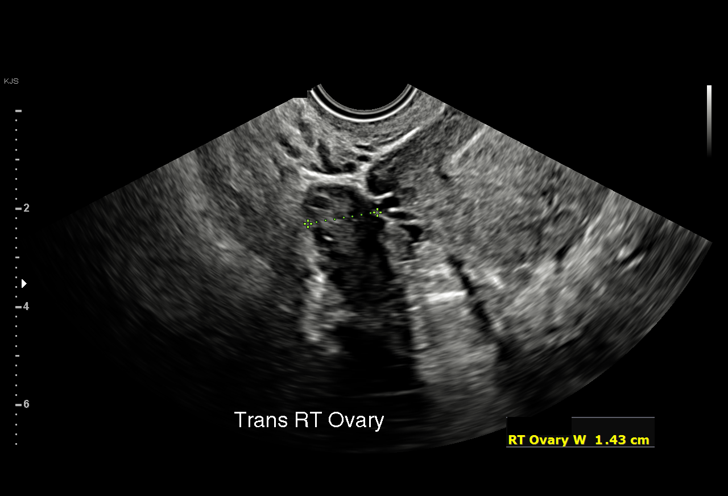
[im 66/72]
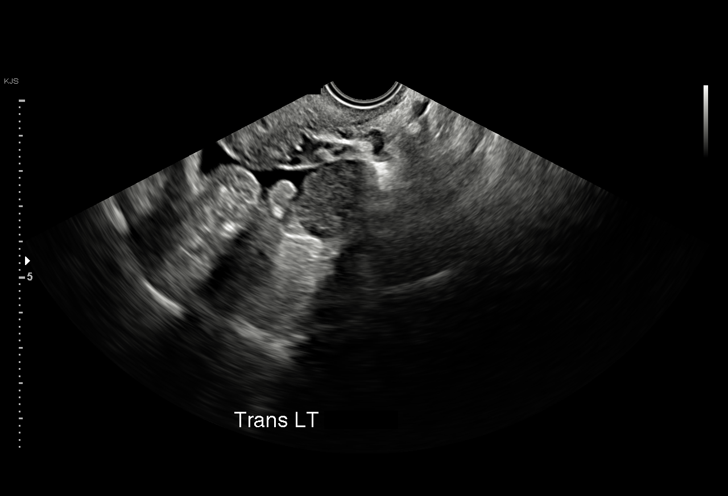
[im 72/72]
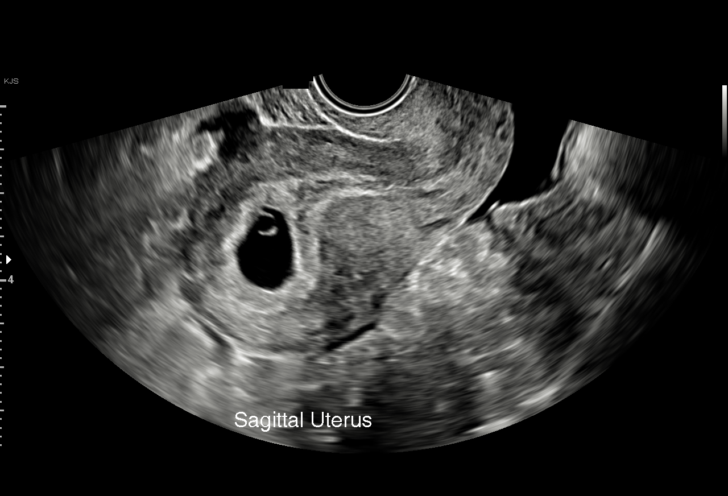

[15 of 28 positions shown; findings below may reference images not displayed]

FINDINGS: Intrauterine gestational sac: Single

Yolk sac:  Visualized.

Embryo:  Visualized.

Cardiac Activity: Visualized.

Heart Rate: 98 bpm

CRL:  4.9 mm   6 w   1 d                  US EDC: March 05, 2022.

Subchorionic hemorrhage:  None visualized.

Maternal uterus/adnexae: Right ovary is unremarkable. Corpus luteum
cyst is seen in left ovary. Mild amount of free fluid is noted in
the pelvis.
IMPRESSION: Single live intrauterine gestation of 6 weeks 1 day.

## 2021-12-05 DIAGNOSIS — F319 Bipolar disorder, unspecified: Secondary | ICD-10-CM | POA: Diagnosis not present

## 2021-12-10 DIAGNOSIS — Z3A27 27 weeks gestation of pregnancy: Secondary | ICD-10-CM | POA: Diagnosis not present

## 2021-12-10 DIAGNOSIS — A599 Trichomoniasis, unspecified: Secondary | ICD-10-CM | POA: Diagnosis not present

## 2021-12-10 DIAGNOSIS — O24112 Pre-existing diabetes mellitus, type 2, in pregnancy, second trimester: Secondary | ICD-10-CM | POA: Diagnosis not present

## 2021-12-10 DIAGNOSIS — B3731 Acute candidiasis of vulva and vagina: Secondary | ICD-10-CM | POA: Diagnosis not present

## 2021-12-10 DIAGNOSIS — Z23 Encounter for immunization: Secondary | ICD-10-CM | POA: Diagnosis not present

## 2021-12-10 DIAGNOSIS — Z113 Encounter for screening for infections with a predominantly sexual mode of transmission: Secondary | ICD-10-CM | POA: Diagnosis not present

## 2021-12-10 DIAGNOSIS — Z3480 Encounter for supervision of other normal pregnancy, unspecified trimester: Secondary | ICD-10-CM | POA: Diagnosis not present

## 2021-12-25 DIAGNOSIS — F319 Bipolar disorder, unspecified: Secondary | ICD-10-CM | POA: Diagnosis not present

## 2021-12-27 DIAGNOSIS — Z113 Encounter for screening for infections with a predominantly sexual mode of transmission: Secondary | ICD-10-CM | POA: Diagnosis not present

## 2021-12-27 DIAGNOSIS — N898 Other specified noninflammatory disorders of vagina: Secondary | ICD-10-CM | POA: Diagnosis not present

## 2021-12-27 DIAGNOSIS — A599 Trichomoniasis, unspecified: Secondary | ICD-10-CM | POA: Diagnosis not present

## 2022-01-01 DIAGNOSIS — F319 Bipolar disorder, unspecified: Secondary | ICD-10-CM | POA: Diagnosis not present

## 2022-01-09 DIAGNOSIS — F319 Bipolar disorder, unspecified: Secondary | ICD-10-CM | POA: Diagnosis not present

## 2022-01-10 DIAGNOSIS — F319 Bipolar disorder, unspecified: Secondary | ICD-10-CM | POA: Diagnosis not present

## 2022-01-10 DIAGNOSIS — O24113 Pre-existing diabetes mellitus, type 2, in pregnancy, third trimester: Secondary | ICD-10-CM | POA: Diagnosis not present

## 2022-01-10 DIAGNOSIS — Z3A31 31 weeks gestation of pregnancy: Secondary | ICD-10-CM | POA: Diagnosis not present

## 2022-01-14 DIAGNOSIS — O24113 Pre-existing diabetes mellitus, type 2, in pregnancy, third trimester: Secondary | ICD-10-CM | POA: Diagnosis not present

## 2022-01-14 DIAGNOSIS — Z3A32 32 weeks gestation of pregnancy: Secondary | ICD-10-CM | POA: Diagnosis not present

## 2022-01-15 DIAGNOSIS — F319 Bipolar disorder, unspecified: Secondary | ICD-10-CM | POA: Diagnosis not present

## 2022-01-17 DIAGNOSIS — Z3A32 32 weeks gestation of pregnancy: Secondary | ICD-10-CM | POA: Diagnosis not present

## 2022-01-17 DIAGNOSIS — O24113 Pre-existing diabetes mellitus, type 2, in pregnancy, third trimester: Secondary | ICD-10-CM | POA: Diagnosis not present

## 2022-01-21 DIAGNOSIS — O99891 Other specified diseases and conditions complicating pregnancy: Secondary | ICD-10-CM | POA: Diagnosis not present

## 2022-01-21 DIAGNOSIS — Z3A33 33 weeks gestation of pregnancy: Secondary | ICD-10-CM | POA: Diagnosis not present

## 2022-01-24 ENCOUNTER — Institutional Professional Consult (permissible substitution): Payer: Federal, State, Local not specified - PPO | Admitting: Pediatrics

## 2022-01-24 DIAGNOSIS — O99891 Other specified diseases and conditions complicating pregnancy: Secondary | ICD-10-CM | POA: Diagnosis not present

## 2022-01-24 DIAGNOSIS — Z3A33 33 weeks gestation of pregnancy: Secondary | ICD-10-CM | POA: Diagnosis not present

## 2022-01-28 DIAGNOSIS — O99891 Other specified diseases and conditions complicating pregnancy: Secondary | ICD-10-CM | POA: Diagnosis not present

## 2022-01-28 DIAGNOSIS — Z3A34 34 weeks gestation of pregnancy: Secondary | ICD-10-CM | POA: Diagnosis not present

## 2022-01-31 DIAGNOSIS — Z3A34 34 weeks gestation of pregnancy: Secondary | ICD-10-CM | POA: Diagnosis not present

## 2022-01-31 DIAGNOSIS — O24113 Pre-existing diabetes mellitus, type 2, in pregnancy, third trimester: Secondary | ICD-10-CM | POA: Diagnosis not present

## 2022-02-02 ENCOUNTER — Inpatient Hospital Stay (HOSPITAL_COMMUNITY)
Admission: AD | Admit: 2022-02-02 | Discharge: 2022-02-03 | Disposition: A | Discharge status: Home / Self Care | Payer: BC Managed Care – PPO | Attending: Obstetrics and Gynecology | Admitting: Obstetrics and Gynecology

## 2022-02-02 ENCOUNTER — Other Ambulatory Visit: Payer: Self-pay

## 2022-02-02 ENCOUNTER — Encounter (HOSPITAL_COMMUNITY): Payer: Self-pay | Admitting: Obstetrics and Gynecology

## 2022-02-02 DIAGNOSIS — B3731 Acute candidiasis of vulva and vagina: Secondary | ICD-10-CM | POA: Diagnosis not present

## 2022-02-02 DIAGNOSIS — R102 Pelvic and perineal pain: Secondary | ICD-10-CM | POA: Diagnosis not present

## 2022-02-02 DIAGNOSIS — O26893 Other specified pregnancy related conditions, third trimester: Secondary | ICD-10-CM | POA: Insufficient documentation

## 2022-02-02 DIAGNOSIS — A599 Trichomoniasis, unspecified: Secondary | ICD-10-CM | POA: Diagnosis not present

## 2022-02-02 DIAGNOSIS — Z3A35 35 weeks gestation of pregnancy: Secondary | ICD-10-CM | POA: Diagnosis not present

## 2022-02-02 DIAGNOSIS — Z7984 Long term (current) use of oral hypoglycemic drugs: Secondary | ICD-10-CM | POA: Insufficient documentation

## 2022-02-02 DIAGNOSIS — O98813 Other maternal infectious and parasitic diseases complicating pregnancy, third trimester: Secondary | ICD-10-CM | POA: Insufficient documentation

## 2022-02-02 DIAGNOSIS — O98313 Other infections with a predominantly sexual mode of transmission complicating pregnancy, third trimester: Secondary | ICD-10-CM | POA: Diagnosis not present

## 2022-02-02 DIAGNOSIS — O24113 Pre-existing diabetes mellitus, type 2, in pregnancy, third trimester: Secondary | ICD-10-CM | POA: Insufficient documentation

## 2022-02-02 DIAGNOSIS — E1169 Type 2 diabetes mellitus with other specified complication: Secondary | ICD-10-CM | POA: Insufficient documentation

## 2022-02-02 LAB — URINALYSIS, ROUTINE W REFLEX MICROSCOPIC
Bilirubin Urine: NEGATIVE
Glucose, UA: NEGATIVE mg/dL
Hgb urine dipstick: NEGATIVE
Ketones, ur: 5 mg/dL — AB
Nitrite: NEGATIVE
Protein, ur: 30 mg/dL — AB
Specific Gravity, Urine: 1.031 — ABNORMAL HIGH (ref 1.005–1.030)
pH: 6 (ref 5.0–8.0)

## 2022-02-02 MED ORDER — METRONIDAZOLE 500 MG PO TABS
500.0000 mg | ORAL_TABLET | Freq: Once | ORAL | Status: AC
  Administered 2022-02-02: 500 mg via ORAL
  Filled 2022-02-02: qty 1

## 2022-02-02 MED ORDER — METFORMIN HCL 500 MG PO TABS
1000.0000 mg | ORAL_TABLET | Freq: Once | ORAL | Status: AC
  Administered 2022-02-02: 1000 mg via ORAL
  Filled 2022-02-02: qty 2

## 2022-02-02 MED ORDER — METFORMIN HCL 1000 MG PO TABS: 1000.0000 mg | ORAL_TABLET | Freq: Two times a day (BID) | ORAL | 1 refills | Status: DC

## 2022-02-02 NOTE — MAU Provider Note (Signed)
History     CSN: [id number]  Arrival date and time: 02/02/22 2136   None     Chief Complaint  Patient presents with   Pelvic Pain   HPI  Jordan Williams is a 26 y.o. female G2P0010 @ [redacted]w[redacted]d here in MAU with complaints of pelvic pain and pressure. Symptoms started today. She reports feeling like something is putting pressure on her back and pelvis. She has not taken anything for the pain. Hx of GDM, on [employer]. Reports out of metformin RX currently. Last dose was yesterday.   OB History     Gravida  2   Para  0   Term  0   Preterm  0   AB  1   Living  0      SAB  1   IAB  0   Ectopic  0   Multiple  0   Live Births  0           Past Medical History:  Diagnosis Date   Anxiety    Bipolar 1 disorder ([place])    Depression    Pre-diabetes     Past Surgical History:  Procedure Laterality Date   NO PAST SURGERIES      Family History  Problem Relation Age of Onset   Healthy Mother    Healthy Father     Social History   Tobacco Use   Smoking status: Never   Smokeless tobacco: Never  Vaping Use   Vaping Use: Never used  Substance Use Topics   Alcohol use: Not Currently    Comment: occ   Drug use: Never    Allergies: No Known Allergies  Medications Prior to Admission  Medication Sig Dispense Refill Last Dose   lamoTRIgine (LAMICTAL) 25 MG tablet Take 50 mg by mouth at bedtime.   02/02/2022   metFORMIN (GLUCOPHAGE) 1000 MG tablet Take 1,000 mg by mouth 2 (two) times daily with a meal.   02/02/2022   Prenatal Vit-Fe Fumarate-FA (PRENATAL MULTIVITAMIN) TABS tablet Take 1 tablet by mouth daily at 12 noon.   02/02/2022   Results for orders placed or performed during the hospital encounter of 02/02/22 (from the past 48 hour(s))  Urinalysis, Routine w reflex microscopic Urine, Clean Catch     Status: Abnormal   Collection Time: 02/02/22 10:20 PM  Result Value Ref Range   Color, Urine YELLOW YELLOW   APPearance HAZY (A) CLEAR   Specific Gravity,  Urine 1.031 (H) 1.005 - 1.030   pH 6.0 5.0 - 8.0   Glucose, UA NEGATIVE NEGATIVE mg/dL   Hgb urine dipstick NEGATIVE NEGATIVE   Bilirubin Urine NEGATIVE NEGATIVE   Ketones, ur 5 (A) NEGATIVE mg/dL   Protein, ur 30 (A) NEGATIVE mg/dL   Nitrite NEGATIVE NEGATIVE   Leukocytes,Ua LARGE (A) NEGATIVE   RBC / HPF 6-10 0 - 5 RBC/hpf   WBC, UA 11-20 0 - 5 WBC/hpf   Bacteria, UA FEW (A) NONE SEEN   Squamous Epithelial / LPF 0-5 0 - 5   Mucus PRESENT    Trichomonas, UA PRESENT (A) NONE SEEN   Budding Yeast PRESENT     Comment: Performed at [facility], [address], [place], [place] [address]     Review of Systems  Gastrointestinal:  Negative for abdominal pain.  Genitourinary:  Positive for pelvic pain. Negative for dysuria, urgency and vaginal bleeding.  Physical Exam   Blood pressure 124/81, pulse 89, temperature 98.3 F (36.8 C), resp. rate  18, last menstrual period 05/29/2021, unknown if currently breastfeeding.  Physical Exam Vitals and nursing note reviewed.  Constitutional:      General: She is not in acute distress.    Appearance: Normal appearance. She is not ill-appearing, toxic-appearing or diaphoretic.  HENT:     Head: Normocephalic.  Genitourinary:    Comments: Cervix FT, 50%, -2 Exam by Noni Saupe, NP  Neurological:     Mental Status: She is alert and oriented to person, place, and time.  Psychiatric:        Mood and Affect: Mood normal.   Fetal Tracing: Baseline: 125 bpm Variability: moderate  Accelerations: 15x15 Decelerations: None Toco: occasional with UI  MAU Course  Procedures None  MDM  + trich, and + yeast First dose of flagyl given tonight along with her metformin dose she missed 1 gram.  GC collected with urine culture.  Patient says she was recently treated with metrogel and reports she had a negative TOC in the office. No recent intercourse.   Assessment and Plan   A:  1. Trichomonas infection   2. Vaginal yeast infection    3. Pelvic pain   4. [redacted] weeks gestation of pregnancy   5. Type 2 diabetes mellitus with other specified complication, without long-term current use of insulin (HCC)      P:  Discharge home Return to MAU if symptoms worsen Rx: Flagly, terazol, metformin Pelvic rest  Khristopher Kapaun, Anderson Malta I, NP 02/03/2022 1:00 AM

## 2022-02-02 NOTE — MAU Note (Signed)
Pt c/o increased pelvic pressure and pain that shoot down her crotch and legs. Denies any vag bleeding or leaking. Good fetal movement felt

## 2022-02-03 DIAGNOSIS — O98313 Other infections with a predominantly sexual mode of transmission complicating pregnancy, third trimester: Secondary | ICD-10-CM | POA: Diagnosis not present

## 2022-02-03 MED ORDER — METRONIDAZOLE 500 MG PO TABS: 500.0000 mg | ORAL_TABLET | Freq: Two times a day (BID) | ORAL | 0 refills | Status: AC

## 2022-02-03 MED ORDER — TERCONAZOLE 0.4 % VA CREA: 1.0000 | TOPICAL_CREAM | Freq: Every day | VAGINAL | 0 refills | Status: DC

## 2022-02-04 LAB — CULTURE, OB URINE: Culture: >=100000 — AB

## 2022-02-04 LAB — GC/CHLAMYDIA PROBE AMP (~~LOC~~) NOT AT ARMC
Chlamydia: NEGATIVE
Comment: NEGATIVE
Comment: NORMAL
Neisseria Gonorrhea: NEGATIVE

## 2022-02-05 DIAGNOSIS — Z3A35 35 weeks gestation of pregnancy: Secondary | ICD-10-CM | POA: Diagnosis not present

## 2022-02-05 DIAGNOSIS — O24113 Pre-existing diabetes mellitus, type 2, in pregnancy, third trimester: Secondary | ICD-10-CM | POA: Diagnosis not present

## 2022-02-11 DIAGNOSIS — O99891 Other specified diseases and conditions complicating pregnancy: Secondary | ICD-10-CM | POA: Diagnosis not present

## 2022-02-11 DIAGNOSIS — Z3A36 36 weeks gestation of pregnancy: Secondary | ICD-10-CM | POA: Diagnosis not present

## 2022-02-11 DIAGNOSIS — Z113 Encounter for screening for infections with a predominantly sexual mode of transmission: Secondary | ICD-10-CM | POA: Diagnosis not present

## 2022-02-11 LAB — OB RESULTS CONSOLE GBS: GBS: POSITIVE

## 2022-02-12 ENCOUNTER — Other Ambulatory Visit: Payer: Self-pay

## 2022-02-12 ENCOUNTER — Ambulatory Visit (INDEPENDENT_AMBULATORY_CARE_PROVIDER_SITE_OTHER): Payer: Self-pay | Admitting: Pediatrics

## 2022-02-12 DIAGNOSIS — Z7681 Expectant parent(s) prebirth pediatrician visit: Secondary | ICD-10-CM

## 2022-02-12 NOTE — Progress Notes (Signed)
Prenatal counseling for impending newborn done. Mother agreed to vaccine policy. Mom is taking Metformin for GDM.  Z76.81

## 2022-02-15 ENCOUNTER — Telehealth (HOSPITAL_COMMUNITY): Payer: Self-pay | Admitting: *Deleted

## 2022-02-15 ENCOUNTER — Encounter (HOSPITAL_COMMUNITY): Payer: Self-pay | Admitting: *Deleted

## 2022-02-15 NOTE — Telephone Encounter (Signed)
Preadmission screen  

## 2022-02-18 DIAGNOSIS — O99891 Other specified diseases and conditions complicating pregnancy: Secondary | ICD-10-CM | POA: Diagnosis not present

## 2022-02-18 DIAGNOSIS — Z3A37 37 weeks gestation of pregnancy: Secondary | ICD-10-CM | POA: Diagnosis not present

## 2022-02-21 ENCOUNTER — Encounter (HOSPITAL_COMMUNITY): Payer: Self-pay | Admitting: Obstetrics and Gynecology

## 2022-02-21 DIAGNOSIS — Z3A37 37 weeks gestation of pregnancy: Secondary | ICD-10-CM | POA: Diagnosis not present

## 2022-02-21 DIAGNOSIS — O24113 Pre-existing diabetes mellitus, type 2, in pregnancy, third trimester: Secondary | ICD-10-CM | POA: Diagnosis not present

## 2022-02-22 ENCOUNTER — Encounter (HOSPITAL_COMMUNITY): Payer: Self-pay | Admitting: Obstetrics and Gynecology

## 2022-02-22 NOTE — H&P (Signed)
Jordan Williams is a 26 y.o. female presenting for IOL s/s T2DM. She has been followed by Dr. Gayla Medicus and has been on Metformin 500mg . Her bs have had fair control. Her last showed an EFW of 80%ile at 56 wga (6#12). She has a hitory of trichomoniasis with a negative TOC. She has BPD c/w lamotrigine 150mg . She is GBS positive without a PCN allergy. She is expecting a RR boy.  ?OB History   ? ? Gravida  ?2  ? Para  ?0  ? Term  ?0  ? Preterm  ?0  ? AB  ?1  ? Living  ?0  ?  ? ? SAB  ?1  ? IAB  ?0  ? Ectopic  ?0  ? Multiple  ?0  ? Live Births  ?0  ?   ?  ?  ? ?Past Medical History:  ?Diagnosis Date  ? Anxiety   ? Asthma   ? when young, no meds currently  ? Bipolar 1 disorder (HCC)   ? Depression   ? PCOS (polycystic ovarian syndrome)   ? Trichomoniasis   ? Type 2 diabetes mellitus (HCC)   ? ?Past Surgical History:  ?Procedure Laterality Date  ? NO PAST SURGERIES    ? ?Family History: family history includes Healthy in her father and mother. ?Social History:  reports that she has never smoked. She has never used smokeless tobacco. She reports that she does not currently use alcohol. She reports that she does not use drugs. ? ? ?  ?Maternal Diabetes: Yes:  Diabetes Type:  Pre-pregnancy ?Genetic Screening: Normal ?Maternal Ultrasounds/Referrals: Normal ?Fetal Ultrasounds or other Referrals:  None ?Maternal Substance Abuse:  No ?Significant Maternal Medications:  None ?Significant Maternal Lab Results:  Group B Strep positive ?Other Comments:  None ? ?Review of Systems ?History ?  ?Last menstrual period 05/29/2021, unknown if currently breastfeeding. ?Exam ?Physical Exam  ?(from office) ?NAD, A&O ?NWOB ?Abd soft, nondistended, gravid ? ?Prenatal labs: ?ABO, Rh: O/Positive/-- (08/10 0000) ?Antibody: Negative (08/10 0000) ?Rubella: Immune (08/10 0000) ?RPR: Nonreactive (08/10 0000)  ?HBsAg: Negative (08/10 0000)  ?HIV: Non-reactive (08/10 0000)  ?GBS: Positive/-- (02/20 0000)  ? ?Assessment/Plan: ?26 yo G2P0 @ 62 wga  presenting for IOL s/s T2DM. ?Cervix borderline - will plan for Pitocin/AROM when able. ?BS per L&D protocol - continue metformin 500mg  BID. ?Continue Lamotrigine.  ?GBS negative.  ?  ? ?30 ?02/22/2022, 1:40 PM ? ? ? ? ?

## 2022-02-24 ENCOUNTER — Inpatient Hospital Stay (HOSPITAL_COMMUNITY): Admit: 2022-02-24 | Payer: Federal, State, Local not specified - PPO | Admitting: Obstetrics & Gynecology

## 2022-02-24 ENCOUNTER — Encounter (HOSPITAL_COMMUNITY): Payer: Self-pay | Admitting: Obstetrics and Gynecology

## 2022-02-24 ENCOUNTER — Inpatient Hospital Stay (HOSPITAL_COMMUNITY)
Admission: AD | Admit: 2022-02-24 | Discharge: 2022-02-28 | DRG: 788 | Disposition: A | Discharge status: Home / Self Care | Payer: BC Managed Care – PPO | Attending: Obstetrics and Gynecology | Admitting: Obstetrics and Gynecology

## 2022-02-24 ENCOUNTER — Other Ambulatory Visit: Payer: Self-pay

## 2022-02-24 DIAGNOSIS — Z20822 Contact with and (suspected) exposure to covid-19: Secondary | ICD-10-CM | POA: Diagnosis present

## 2022-02-24 DIAGNOSIS — O99824 Streptococcus B carrier state complicating childbirth: Secondary | ICD-10-CM | POA: Diagnosis present

## 2022-02-24 DIAGNOSIS — O2412 Pre-existing diabetes mellitus, type 2, in childbirth: Principal | ICD-10-CM | POA: Diagnosis present

## 2022-02-24 DIAGNOSIS — Z7984 Long term (current) use of oral hypoglycemic drugs: Secondary | ICD-10-CM | POA: Diagnosis not present

## 2022-02-24 DIAGNOSIS — E119 Type 2 diabetes mellitus without complications: Secondary | ICD-10-CM | POA: Diagnosis not present

## 2022-02-24 DIAGNOSIS — Z3A38 38 weeks gestation of pregnancy: Secondary | ICD-10-CM | POA: Diagnosis not present

## 2022-02-24 DIAGNOSIS — Z3490 Encounter for supervision of normal pregnancy, unspecified, unspecified trimester: Secondary | ICD-10-CM

## 2022-02-24 DIAGNOSIS — O24425 Gestational diabetes mellitus in childbirth, controlled by oral hypoglycemic drugs: Secondary | ICD-10-CM | POA: Diagnosis not present

## 2022-02-24 DIAGNOSIS — O479 False labor, unspecified: Secondary | ICD-10-CM | POA: Diagnosis not present

## 2022-02-24 DIAGNOSIS — O24113 Pre-existing diabetes mellitus, type 2, in pregnancy, third trimester: Secondary | ICD-10-CM

## 2022-02-24 HISTORY — DX: Trichomoniasis, unspecified: A59.9

## 2022-02-24 HISTORY — DX: Polycystic ovarian syndrome: E28.2

## 2022-02-24 HISTORY — DX: Unspecified asthma, uncomplicated: J45.909

## 2022-02-24 HISTORY — DX: Type 2 diabetes mellitus without complications: E11.9

## 2022-02-24 LAB — GLUCOSE, CAPILLARY
Glucose-Capillary: 67 mg/dL — ABNORMAL LOW (ref 70–99)
Glucose-Capillary: 128 mg/dL — ABNORMAL HIGH (ref 70–99)
Glucose-Capillary: 136 mg/dL — ABNORMAL HIGH (ref 70–99)

## 2022-02-24 LAB — TYPE AND SCREEN
ABO/RH(D): O POS
Antibody Screen: NEGATIVE

## 2022-02-24 LAB — RESP PANEL BY RT-PCR (FLU A&B, COVID) ARPGX2
Influenza A by PCR: NEGATIVE
Influenza B by PCR: NEGATIVE
SARS Coronavirus 2 by RT PCR: NEGATIVE

## 2022-02-24 LAB — CBC
HCT: 40.7 % (ref 36.0–46.0)
Hemoglobin: 13.6 g/dL (ref 12.0–15.0)
MCH: 30.5 pg (ref 26.0–34.0)
MCHC: 33.4 g/dL (ref 30.0–36.0)
MCV: 91.3 fL (ref 80.0–100.0)
Platelets: 237 10*3/uL (ref 150–400)
RBC: 4.46 MIL/uL (ref 3.87–5.11)
RDW: 13.1 % (ref 11.5–15.5)
WBC: 8.9 10*3/uL (ref 4.0–10.5)
nRBC: 0 % (ref 0.0–0.2)

## 2022-02-24 MED ORDER — PENICILLIN G POT IN DEXTROSE 60000 UNIT/ML IV SOLN
3.0000 10*6.[IU] | INTRAVENOUS | Status: DC
  Administered 2022-02-24 – 2022-02-25 (×5): 3 10*6.[IU] via INTRAVENOUS
  Filled 2022-02-24 (×5): qty 50

## 2022-02-24 MED ORDER — ZOLPIDEM TARTRATE 5 MG PO TABS: 5.0000 mg | ORAL_TABLET | Freq: Every evening | ORAL | Status: DC | PRN

## 2022-02-24 MED ORDER — LAMOTRIGINE 150 MG PO TABS
150.0000 mg | ORAL_TABLET | Freq: Every day | ORAL | Status: DC
  Administered 2022-02-24 – 2022-02-27 (×2): 150 mg via ORAL
  Filled 2022-02-24 (×6): qty 1

## 2022-02-24 MED ORDER — HYDROXYZINE HCL 50 MG PO TABS: 50.0000 mg | ORAL_TABLET | Freq: Four times a day (QID) | ORAL | Status: DC | PRN

## 2022-02-24 MED ORDER — OXYTOCIN-SODIUM CHLORIDE 30-0.9 UT/500ML-% IV SOLN: 2.5000 [IU]/h | INTRAVENOUS | Status: DC

## 2022-02-24 MED ORDER — BUTORPHANOL TARTRATE 1 MG/ML IJ SOLN: 1.0000 mg | INTRAMUSCULAR | Status: DC | PRN

## 2022-02-24 MED ORDER — OXYCODONE-ACETAMINOPHEN 5-325 MG PO TABS: 1.0000 | ORAL_TABLET | ORAL | Status: DC | PRN

## 2022-02-24 MED ORDER — LIDOCAINE HCL (PF) 1 % IJ SOLN: 30.0000 mL | INTRAMUSCULAR | Status: DC | PRN

## 2022-02-24 MED ORDER — ONDANSETRON HCL 4 MG/2ML IJ SOLN: 4.0000 mg | Freq: Four times a day (QID) | INTRAMUSCULAR | Status: DC | PRN

## 2022-02-24 MED ORDER — TERBUTALINE SULFATE 1 MG/ML IJ SOLN: 0.2500 mg | Freq: Once | INTRAMUSCULAR | Status: DC | PRN

## 2022-02-24 MED ORDER — OXYTOCIN-SODIUM CHLORIDE 30-0.9 UT/500ML-% IV SOLN
1.0000 m[IU]/min | INTRAVENOUS | Status: DC
  Administered 2022-02-25: 2 m[IU]/min via INTRAVENOUS
  Filled 2022-02-24: qty 500

## 2022-02-24 MED ORDER — LAMOTRIGINE ER 50 MG PO TB24: 150.0000 mg | ORAL_TABLET | Freq: Every day | ORAL | Status: DC

## 2022-02-24 MED ORDER — METFORMIN HCL 500 MG PO TABS
500.0000 mg | ORAL_TABLET | Freq: Two times a day (BID) | ORAL | Status: DC
  Administered 2022-02-25 – 2022-02-28 (×6): 500 mg via ORAL
  Filled 2022-02-24 (×8): qty 1

## 2022-02-24 MED ORDER — LACTATED RINGERS IV SOLN: INTRAVENOUS | Status: DC

## 2022-02-24 MED ORDER — SODIUM CHLORIDE 0.9 % IV SOLN
5.0000 10*6.[IU] | Freq: Once | INTRAVENOUS | Status: AC
  Administered 2022-02-24: 5 10*6.[IU] via INTRAVENOUS
  Filled 2022-02-24: qty 5

## 2022-02-24 MED ORDER — ACETAMINOPHEN 325 MG PO TABS: 650.0000 mg | ORAL_TABLET | ORAL | Status: DC | PRN

## 2022-02-24 MED ORDER — LACTATED RINGERS IV SOLN: 500.0000 mL | INTRAVENOUS | Status: DC | PRN

## 2022-02-24 MED ORDER — SOD CITRATE-CITRIC ACID 500-334 MG/5ML PO SOLN
30.0000 mL | ORAL | Status: DC | PRN
  Administered 2022-02-25: 30 mL via ORAL
  Filled 2022-02-24: qty 30

## 2022-02-24 MED ORDER — OXYTOCIN BOLUS FROM INFUSION: 333.0000 mL | Freq: Once | INTRAVENOUS | Status: DC

## 2022-02-24 MED ORDER — OXYCODONE-ACETAMINOPHEN 5-325 MG PO TABS: 2.0000 | ORAL_TABLET | ORAL | Status: DC | PRN

## 2022-02-24 NOTE — Progress Notes (Signed)
Patient scheduled for IOL tonight at midnight.  She presented to MAU for labor check.  CVX 1 cm and patient not in labor.  She requested admission to L&D now rather than returned in a few hours.  I agreed to admit.  On arrival to L&D, she requested to eat.  She was allowed to eat a light meal prior to starting IOL.  She also presented birth plan which includes preference for FB only; no medication for IOL.  Patient counseled that this is not typically how I initiate IOL.  I would recommend misoprostol.  Patient prefers FB alone.   ? ?Cat I tracing  ?CVX 1/50/-3 ?Cook FB placed without difficult with 60 cc NS ? ?No other updates to H&P. ? ?Mitchel Honour, DO ? ? ?

## 2022-02-24 NOTE — MAU Provider Note (Signed)
S: Ms. Jordan Williams is a 26 y.o. G2P0010 at [redacted]w[redacted]d  who presents to MAU today complaining contractions q 5-10 minutes since 1400. She denies vaginal bleeding. She denies LOF. She reports normal fetal movement.   ? ?O: BP 128/81   Pulse 92   Temp 98.3 ?F (36.8 ?C) (Oral)   Resp 17   LMP 05/29/2021 (Exact Date)   SpO2 100%  ?GENERAL: Well-developed, well-nourished female in no acute distress.  ?HEAD: Normocephalic, atraumatic.  ?CHEST: Normal effort of breathing, regular heart rate ?ABDOMEN: Soft, nontender, gravid ? ?Cervical exam:  ?Dilation: 1 ?Cervical Position: Posterior ?Presentation: Vertex ?Exam by:: Cleone Slim CNM ? ? ?Fetal Monitoring: ?Baseline: 125 ?Variability: moderate ?Accelerations: 15x15 ?Decelerations: none ?Contractions: none ? ?Patient observed and rechecked after 1 hour with no cervical change. Patient requesting to stay since her IOL is scheduled for midnight.  ? ?Dr. Langston Masker notified of patient arrival and request. MD ok with patient staying if labor and delivery has space on the unit.  ? ?Labor and delivery charge nurse notified of patient request. Ok to stay and can come to floor in 45 minutes ? ?Patient updated and thankful for early admission ? ?A: ?SIUP at [redacted]w[redacted]d  ?False labor ?Type 2 DM ? ?P: ?Admit to labor and delivery ?Care turned over to MD ? ? ?Rolm Bookbinder, CNM ?02/24/2022 5:15 PM ? ?

## 2022-02-24 NOTE — Progress Notes (Signed)
Hypoglycemic Event ? ?CBG: 67 ? ?Treatment: Patient will eat dinner.  ? ?Symptoms: None ? ? ?Comments/MD notified: Notified Dr. Langston Masker of CBG result. MD says patient can have dinner and other orders received. ? ? ? ?Lajoyce Lauber Merrill Deanda ? ? ?

## 2022-02-24 NOTE — MAU Note (Signed)
.  Jordan Williams is a 26 y.o. at [redacted]w[redacted]d here in MAU reporting: ctx q5 min for the past 1.5 hrs.  Denies LOF or vag bleeding.  ? ?Onset of complaint: 1300 ?Pain score: 6/10 ? ?

## 2022-02-25 ENCOUNTER — Inpatient Hospital Stay (HOSPITAL_COMMUNITY): Payer: BC Managed Care – PPO

## 2022-02-25 ENCOUNTER — Encounter (HOSPITAL_COMMUNITY)
Admission: AD | Disposition: A | Discharge status: Home / Self Care | Payer: Self-pay | Source: Home / Self Care | Attending: Obstetrics and Gynecology

## 2022-02-25 ENCOUNTER — Inpatient Hospital Stay (HOSPITAL_COMMUNITY): Payer: BC Managed Care – PPO | Admitting: Anesthesiology

## 2022-02-25 ENCOUNTER — Encounter (HOSPITAL_COMMUNITY): Payer: Self-pay | Admitting: Obstetrics and Gynecology

## 2022-02-25 DIAGNOSIS — O24113 Pre-existing diabetes mellitus, type 2, in pregnancy, third trimester: Secondary | ICD-10-CM | POA: Diagnosis not present

## 2022-02-25 DIAGNOSIS — Z3A38 38 weeks gestation of pregnancy: Secondary | ICD-10-CM | POA: Diagnosis not present

## 2022-02-25 DIAGNOSIS — O24425 Gestational diabetes mellitus in childbirth, controlled by oral hypoglycemic drugs: Secondary | ICD-10-CM | POA: Diagnosis not present

## 2022-02-25 LAB — GLUCOSE, CAPILLARY
Glucose-Capillary: 83 mg/dL (ref 70–99)
Glucose-Capillary: 131 mg/dL — ABNORMAL HIGH (ref 70–99)
Glucose-Capillary: 96 mg/dL (ref 70–99)
Glucose-Capillary: 93 mg/dL (ref 70–99)
Glucose-Capillary: 85 mg/dL (ref 70–99)
Glucose-Capillary: 85 mg/dL (ref 70–99)
Glucose-Capillary: 65 mg/dL — ABNORMAL LOW (ref 70–99)
Glucose-Capillary: 57 mg/dL — ABNORMAL LOW (ref 70–99)
Glucose-Capillary: 83 mg/dL (ref 70–99)
Glucose-Capillary: 84 mg/dL (ref 70–99)
Glucose-Capillary: 103 mg/dL — ABNORMAL HIGH (ref 70–99)

## 2022-02-25 LAB — RPR: RPR Ser Ql: NONREACTIVE

## 2022-02-25 SURGERY — Surgical Case
Anesthesia: Epidural

## 2022-02-25 MED ORDER — LIDOCAINE HCL (PF) 1 % IJ SOLN
INTRAMUSCULAR | Status: DC | PRN
Start: 2022-02-25 — End: 2022-02-25
  Administered 2022-02-25: 5 mL via EPIDURAL

## 2022-02-25 MED ORDER — PHENYLEPHRINE 40 MCG/ML (10ML) SYRINGE FOR IV PUSH (FOR BLOOD PRESSURE SUPPORT)
PREFILLED_SYRINGE | INTRAVENOUS | Status: AC
  Filled 2022-02-25: qty 10

## 2022-02-25 MED ORDER — SIMETHICONE 80 MG PO CHEW
80.0000 mg | CHEWABLE_TABLET | Freq: Three times a day (TID) | ORAL | Status: DC
  Administered 2022-02-27 – 2022-02-28 (×3): 80 mg via ORAL
  Filled 2022-02-25 (×4): qty 1

## 2022-02-25 MED ORDER — PROMETHAZINE HCL 25 MG/ML IJ SOLN: 6.2500 mg | INTRAMUSCULAR | Status: DC | PRN

## 2022-02-25 MED ORDER — LACTATED RINGERS IV SOLN: INTRAVENOUS | Status: DC

## 2022-02-25 MED ORDER — FENTANYL-BUPIVACAINE-NACL 0.5-0.125-0.9 MG/250ML-% EP SOLN
12.0000 mL/h | EPIDURAL | Status: DC | PRN
  Administered 2022-02-25: 12 mL/h via EPIDURAL
  Filled 2022-02-25: qty 250

## 2022-02-25 MED ORDER — DIPHENHYDRAMINE HCL 50 MG/ML IJ SOLN: 12.5000 mg | INTRAMUSCULAR | Status: DC | PRN

## 2022-02-25 MED ORDER — LACTATED RINGERS IV SOLN: INTRAVENOUS | Status: DC | PRN

## 2022-02-25 MED ORDER — OXYCODONE HCL 5 MG PO TABS: 5.0000 mg | ORAL_TABLET | ORAL | Status: DC | PRN

## 2022-02-25 MED ORDER — DIPHENHYDRAMINE HCL 25 MG PO CAPS: 25.0000 mg | ORAL_CAPSULE | Freq: Four times a day (QID) | ORAL | Status: DC | PRN

## 2022-02-25 MED ORDER — EPHEDRINE 5 MG/ML INJ: 10.0000 mg | INTRAVENOUS | Status: DC | PRN

## 2022-02-25 MED ORDER — CEFAZOLIN SODIUM-DEXTROSE 2-4 GM/100ML-% IV SOLN
INTRAVENOUS | Status: AC
  Filled 2022-02-25: qty 100

## 2022-02-25 MED ORDER — ONDANSETRON HCL 4 MG/2ML IJ SOLN
INTRAMUSCULAR | Status: DC | PRN
  Administered 2022-02-25: 4 mg via INTRAVENOUS

## 2022-02-25 MED ORDER — OXYTOCIN-SODIUM CHLORIDE 30-0.9 UT/500ML-% IV SOLN
INTRAVENOUS | Status: DC | PRN
  Administered 2022-02-25: 200 mL via INTRAVENOUS

## 2022-02-25 MED ORDER — SENNOSIDES-DOCUSATE SODIUM 8.6-50 MG PO TABS
2.0000 | ORAL_TABLET | Freq: Every day | ORAL | Status: DC
  Administered 2022-02-27: 2 via ORAL
  Filled 2022-02-25 (×2): qty 2

## 2022-02-25 MED ORDER — SIMETHICONE 80 MG PO CHEW: 80.0000 mg | CHEWABLE_TABLET | ORAL | Status: DC | PRN

## 2022-02-25 MED ORDER — PHENYLEPHRINE 40 MCG/ML (10ML) SYRINGE FOR IV PUSH (FOR BLOOD PRESSURE SUPPORT)
80.0000 ug | PREFILLED_SYRINGE | INTRAVENOUS | Status: DC | PRN
  Administered 2022-02-25: 80 ug via INTRAVENOUS

## 2022-02-25 MED ORDER — FENTANYL CITRATE (PF) 100 MCG/2ML IJ SOLN
INTRAMUSCULAR | Status: DC | PRN
  Administered 2022-02-25: 100 ug via INTRAVENOUS

## 2022-02-25 MED ORDER — MENTHOL 3 MG MT LOZG: 1.0000 | LOZENGE | OROMUCOSAL | Status: DC | PRN

## 2022-02-25 MED ORDER — MORPHINE SULFATE (PF) 0.5 MG/ML IJ SOLN
INTRAMUSCULAR | Status: AC
  Filled 2022-02-25: qty 10

## 2022-02-25 MED ORDER — COCONUT OIL OIL
1.0000 "application " | TOPICAL_OIL | Status: DC | PRN
  Administered 2022-02-27: 1 via TOPICAL

## 2022-02-25 MED ORDER — TETANUS-DIPHTH-ACELL PERTUSSIS 5-2.5-18.5 LF-MCG/0.5 IM SUSY: 0.5000 mL | PREFILLED_SYRINGE | Freq: Once | INTRAMUSCULAR | Status: DC

## 2022-02-25 MED ORDER — IBUPROFEN 600 MG PO TABS
600.0000 mg | ORAL_TABLET | Freq: Four times a day (QID) | ORAL | Status: DC
  Administered 2022-02-27 (×3): 600 mg via ORAL
  Filled 2022-02-25 (×4): qty 1

## 2022-02-25 MED ORDER — OXYCODONE HCL 5 MG PO TABS: 5.0000 mg | ORAL_TABLET | Freq: Once | ORAL | Status: DC | PRN

## 2022-02-25 MED ORDER — SCOPOLAMINE 1 MG/3DAYS TD PT72
1.0000 | MEDICATED_PATCH | Freq: Once | TRANSDERMAL | Status: DC
  Administered 2022-02-25: 1.5 mg via TRANSDERMAL

## 2022-02-25 MED ORDER — LIDOCAINE-EPINEPHRINE (PF) 2 %-1:200000 IJ SOLN
INTRAMUSCULAR | Status: DC | PRN
  Administered 2022-02-25 (×2): 5 mL via EPIDURAL

## 2022-02-25 MED ORDER — HYDROMORPHONE HCL 1 MG/ML IJ SOLN: 0.2000 mg | INTRAMUSCULAR | Status: DC | PRN

## 2022-02-25 MED ORDER — ACETAMINOPHEN 500 MG PO TABS
1000.0000 mg | ORAL_TABLET | Freq: Four times a day (QID) | ORAL | Status: DC
  Administered 2022-02-26 – 2022-02-27 (×5): 1000 mg via ORAL
  Filled 2022-02-25 (×6): qty 2

## 2022-02-25 MED ORDER — ACETAMINOPHEN 10 MG/ML IV SOLN
INTRAVENOUS | Status: DC | PRN
Start: 2022-02-25 — End: 2022-02-25
  Administered 2022-02-25: 1000 mg via INTRAVENOUS

## 2022-02-25 MED ORDER — PHENYLEPHRINE 40 MCG/ML (10ML) SYRINGE FOR IV PUSH (FOR BLOOD PRESSURE SUPPORT): 80.0000 ug | PREFILLED_SYRINGE | INTRAVENOUS | Status: DC | PRN

## 2022-02-25 MED ORDER — MORPHINE SULFATE (PF) 0.5 MG/ML IJ SOLN
INTRAMUSCULAR | Status: DC | PRN
  Administered 2022-02-25: 3 mg via EPIDURAL

## 2022-02-25 MED ORDER — FENTANYL CITRATE (PF) 100 MCG/2ML IJ SOLN
INTRAMUSCULAR | Status: AC
  Filled 2022-02-25: qty 2

## 2022-02-25 MED ORDER — KETOROLAC TROMETHAMINE 30 MG/ML IJ SOLN: 30.0000 mg | Freq: Once | INTRAMUSCULAR | Status: DC

## 2022-02-25 MED ORDER — ONDANSETRON HCL 4 MG/2ML IJ SOLN
INTRAMUSCULAR | Status: AC
  Filled 2022-02-25: qty 2

## 2022-02-25 MED ORDER — DIBUCAINE (PERIANAL) 1 % EX OINT: 1.0000 "application " | TOPICAL_OINTMENT | CUTANEOUS | Status: DC | PRN

## 2022-02-25 MED ORDER — ACETAMINOPHEN 10 MG/ML IV SOLN: 1000.0000 mg | Freq: Once | INTRAVENOUS | Status: DC | PRN

## 2022-02-25 MED ORDER — KETOROLAC TROMETHAMINE 30 MG/ML IJ SOLN
30.0000 mg | Freq: Four times a day (QID) | INTRAMUSCULAR | Status: AC
  Administered 2022-02-26: 30 mg via INTRAVENOUS
  Filled 2022-02-25 (×2): qty 1

## 2022-02-25 MED ORDER — WITCH HAZEL-GLYCERIN EX PADS: 1.0000 "application " | MEDICATED_PAD | CUTANEOUS | Status: DC | PRN

## 2022-02-25 MED ORDER — NALOXONE HCL 4 MG/10ML IJ SOLN
1.0000 ug/kg/h | INTRAVENOUS | Status: DC | PRN
  Filled 2022-02-25: qty 5

## 2022-02-25 MED ORDER — CEFAZOLIN SODIUM-DEXTROSE 2-3 GM-%(50ML) IV SOLR
INTRAVENOUS | Status: DC | PRN
  Administered 2022-02-25: 2 g via INTRAVENOUS

## 2022-02-25 MED ORDER — NALOXONE HCL 0.4 MG/ML IJ SOLN: 0.4000 mg | INTRAMUSCULAR | Status: DC | PRN

## 2022-02-25 MED ORDER — ONDANSETRON HCL 4 MG/2ML IJ SOLN
4.0000 mg | Freq: Three times a day (TID) | INTRAMUSCULAR | Status: DC | PRN
  Administered 2022-02-25: 4 mg via INTRAVENOUS
  Filled 2022-02-25: qty 2

## 2022-02-25 MED ORDER — SODIUM CHLORIDE 0.9 % IR SOLN
Status: DC | PRN
Start: 2022-02-25 — End: 2022-02-25
  Administered 2022-02-25: 1

## 2022-02-25 MED ORDER — SCOPOLAMINE 1 MG/3DAYS TD PT72
MEDICATED_PATCH | TRANSDERMAL | Status: AC
  Filled 2022-02-25: qty 1

## 2022-02-25 MED ORDER — FENTANYL CITRATE (PF) 100 MCG/2ML IJ SOLN: 25.0000 ug | INTRAMUSCULAR | Status: DC | PRN

## 2022-02-25 MED ORDER — LIDOCAINE-EPINEPHRINE (PF) 1.5 %-1:200000 IJ SOLN
INTRAMUSCULAR | Status: DC | PRN
  Administered 2022-02-25: 5 mL via EPIDURAL

## 2022-02-25 MED ORDER — ZOLPIDEM TARTRATE 5 MG PO TABS: 5.0000 mg | ORAL_TABLET | Freq: Every evening | ORAL | Status: DC | PRN

## 2022-02-25 MED ORDER — LACTATED RINGERS IV SOLN
500.0000 mL | Freq: Once | INTRAVENOUS | Status: AC
  Administered 2022-02-25: 500 mL via INTRAVENOUS

## 2022-02-25 MED ORDER — OXYTOCIN-SODIUM CHLORIDE 30-0.9 UT/500ML-% IV SOLN: 2.5000 [IU]/h | INTRAVENOUS | Status: AC

## 2022-02-25 MED ORDER — SODIUM CHLORIDE 0.9% FLUSH: 3.0000 mL | INTRAVENOUS | Status: DC | PRN

## 2022-02-25 MED ORDER — PRENATAL MULTIVITAMIN CH
1.0000 | ORAL_TABLET | Freq: Every day | ORAL | Status: DC
  Administered 2022-02-26 – 2022-02-27 (×2): 1 via ORAL
  Filled 2022-02-25 (×2): qty 1

## 2022-02-25 MED ORDER — OXYCODONE HCL 5 MG/5ML PO SOLN: 5.0000 mg | Freq: Once | ORAL | Status: DC | PRN

## 2022-02-25 SURGICAL SUPPLY — 38 items
BENZOIN TINCTURE PRP APPL 2/3 (GAUZE/BANDAGES/DRESSINGS) ×2 IMPLANT
CHLORAPREP W/TINT 26ML (MISCELLANEOUS) ×2 IMPLANT
CLAMP CORD UMBIL (MISCELLANEOUS) ×1 IMPLANT
CLOSURE STERI STRIP 1/2 X4 (GAUZE/BANDAGES/DRESSINGS) ×1 IMPLANT
CLOTH BEACON ORANGE TIMEOUT ST (SAFETY) ×2 IMPLANT
CLSR STERI-STRIP ANTIMIC 1/2X4 (GAUZE/BANDAGES/DRESSINGS) ×2 IMPLANT
DRSG OPSITE POSTOP 4X10 (GAUZE/BANDAGES/DRESSINGS) ×2 IMPLANT
ELECT REM PT RETURN 9FT ADLT (ELECTROSURGICAL) ×2
ELECTRODE REM PT RTRN 9FT ADLT (ELECTROSURGICAL) ×1 IMPLANT
EXTRACTOR VACUUM KIWI (MISCELLANEOUS) IMPLANT
GAUZE SPONGE 4X4 12PLY STRL LF (GAUZE/BANDAGES/DRESSINGS) ×2 IMPLANT
GLOVE BIO SURGEON STRL SZ 6.5 (GLOVE) ×2 IMPLANT
GLOVE BIOGEL PI IND STRL 6.5 (GLOVE) ×1 IMPLANT
GLOVE BIOGEL PI IND STRL 7.0 (GLOVE) ×2 IMPLANT
GLOVE BIOGEL PI INDICATOR 6.5 (GLOVE) ×1
GLOVE BIOGEL PI INDICATOR 7.0 (GLOVE) ×2
GOWN STRL REUS W/TWL LRG LVL3 (GOWN DISPOSABLE) ×4 IMPLANT
KIT ABG SYR 3ML LUER SLIP (SYRINGE) ×2 IMPLANT
NDL HYPO 25X5/8 SAFETYGLIDE (NEEDLE) ×1 IMPLANT
NEEDLE HYPO 25X5/8 SAFETYGLIDE (NEEDLE) ×2 IMPLANT
NS IRRIG 1000ML POUR BTL (IV SOLUTION) ×2 IMPLANT
PACK C SECTION WH (CUSTOM PROCEDURE TRAY) ×2 IMPLANT
PAD ABD 7.5X8 STRL (GAUZE/BANDAGES/DRESSINGS) ×1 IMPLANT
PAD ABD 8X7 1/2 STERILE (GAUZE/BANDAGES/DRESSINGS) ×1 IMPLANT
PAD OB MATERNITY 4.3X12.25 (PERSONAL CARE ITEMS) ×2 IMPLANT
PENCIL SMOKE EVAC W/HOLSTER (ELECTROSURGICAL) ×2 IMPLANT
RETRACTOR TRAXI PANNICULUS (MISCELLANEOUS) IMPLANT
SUT PLAIN 0 NONE (SUTURE) IMPLANT
SUT PLAIN 2 0 (SUTURE) ×2
SUT PLAIN ABS 2-0 CT1 27XMFL (SUTURE) ×1 IMPLANT
SUT VIC AB 0 CT1 36 (SUTURE) ×3 IMPLANT
SUT VIC AB 0 CTX 36 (SUTURE) ×4
SUT VIC AB 0 CTX36XBRD ANBCTRL (SUTURE) ×2 IMPLANT
SUT VIC AB 4-0 PS2 27 (SUTURE) ×2 IMPLANT
TOWEL OR 17X24 6PK STRL BLUE (TOWEL DISPOSABLE) ×2 IMPLANT
TRAXI PANNICULUS RETRACTOR (MISCELLANEOUS) ×1
TRAY FOLEY W/BAG SLVR 14FR LF (SET/KITS/TRAYS/PACK) IMPLANT
WATER STERILE IRR 1000ML POUR (IV SOLUTION) ×2 IMPLANT

## 2022-02-25 NOTE — Anesthesia Preprocedure Evaluation (Signed)
Anesthesia Evaluation  ?Patient identified by MRN, date of birth, ID band ?Patient awake ? ? ? ?Reviewed: ?Allergy & Precautions, H&P , NPO status , Patient's Chart, lab work & pertinent test results ? ?History of Anesthesia Complications ?Negative for: history of anesthetic complications ? ?Airway ?Mallampati: II ? ?TM Distance: >3 FB ?Neck ROM: full ? ? ? Dental ?no notable dental hx. ?(+) Teeth Intact ?  ?Pulmonary ?asthma ,  ?  ?Pulmonary exam normal ?breath sounds clear to auscultation ? ? ? ? ? ? Cardiovascular ?negative cardio ROS ?Normal cardiovascular exam ?Rhythm:regular Rate:Normal ? ? ?  ?Neuro/Psych ?PSYCHIATRIC DISORDERS Anxiety Depression Bipolar Disorder negative neurological ROS ?   ? GI/Hepatic ?negative GI ROS, Neg liver ROS,   ?Endo/Other  ?diabetes, Oral Hypoglycemic AgentsPCOS ? Renal/GU ?negative Renal ROS  ?negative genitourinary ?  ?Musculoskeletal ? ? Abdominal ?(+) + obese,   ?Peds ? Hematology ?negative hematology ROS ?(+)   ?Anesthesia Other Findings ? ? Reproductive/Obstetrics ?(+) Pregnancy ? ?  ? ? ? ? ? ? ? ? ? ? ? ? ? ?  ?  ? ? ? ? ? ? ? ? ?Anesthesia Physical ?Anesthesia Plan ? ?ASA: 2 ? ?Anesthesia Plan: Epidural  ? ?Post-op Pain Management:   ? ?Induction:  ? ?PONV Risk Score and Plan:  ? ?Airway Management Planned:  ? ?Additional Equipment:  ? ?Intra-op Plan:  ? ?Post-operative Plan:  ? ?Informed Consent: I have reviewed the patients History and Physical, chart, labs and discussed the procedure including the risks, benefits and alternatives for the proposed anesthesia with the patient or authorized representative who has indicated his/her understanding and acceptance.  ? ? ? ? ? ?Plan Discussed with:  ? ?Anesthesia Plan Comments:   ? ? ? ? ? ? ?Anesthesia Quick Evaluation ? ?

## 2022-02-25 NOTE — Anesthesia Procedure Notes (Signed)
Epidural ?Patient location during procedure: OB ?Start time: 02/25/2022 12:11 PM ?End time: 02/25/2022 12:21 PM ? ?Staffing ?Anesthesiologist: Murvin Natal, MD ?Performed: anesthesiologist  ? ?Preanesthetic Checklist ?Completed: patient identified, IV checked, site marked, risks and benefits discussed, monitors and equipment checked, pre-op evaluation and timeout performed ? ?Epidural ?Patient position: sitting ?Prep: DuraPrep ?Patient monitoring: heart rate, cardiac monitor, continuous pulse ox and blood pressure ?Approach: midline ?Location: L4-L5 ?Injection technique: LOR air ? ?Needle:  ?Needle type: Tuohy  ?Needle gauge: 17 G ?Needle length: 9 cm ?Needle insertion depth: 6 cm ?Catheter type: closed end flexible ?Catheter size: 19 Gauge ?Catheter at skin depth: 11 cm ?Test dose: negative and 1.5% lidocaine with Epi 1:200 K ? ?Assessment ?Events: blood not aspirated, injection not painful, no injection resistance and negative IV test ? ?Additional Notes ?Informed consent obtained prior to proceeding including risk of failure, 1% risk of PDPH, risk of minor discomfort and bruising.  Discussed alternatives to epidural analgesia and patient desires to proceed.  Timeout performed pre-procedure verifying patient name, procedure, and platelet count.  Patient tolerated procedure well. ?Reason for block:procedure for pain ? ? ? ?

## 2022-02-25 NOTE — Transfer of Care (Signed)
Immediate Anesthesia Transfer of Care Note ? ?Patient: Jordan Williams ? ?Procedure(s) Performed: CESAREAN SECTION ? ?Patient Location: PACU ? ?Anesthesia Type:Epidural ? ?Level of Consciousness: awake, alert , oriented and patient cooperative ? ?Airway & Oxygen Therapy: Patient Spontanous Breathing ? ?Post-op Assessment: Report given to RN and Post -op Vital signs reviewed and stable ? ?Post vital signs: Reviewed and stable ? ?Last Vitals:  ?Vitals Value Taken Time  ?BP 110/70 02/25/22 1834  ?Temp    ?Pulse 100 02/25/22 1836  ?Resp 22 02/25/22 1836  ?SpO2 96 % 02/25/22 1836  ?Vitals shown include unvalidated device data. ? ?Last Pain:  ?Vitals:  ? 02/25/22 1642  ?TempSrc: Axillary  ?PainSc:   ?   ? ?Patients Stated Pain Goal: 0 (02/24/22 1540) ? ?Complications: No notable events documented. ?

## 2022-02-25 NOTE — Progress Notes (Signed)
SVE unchanged despite adequate MVUs x 6 hours. Has been 6cm x 8 hours.  Cervix remains swollen despite position changes.  ?Failed IOL in active phase d/w pt and family.  ?I recommend primary cesarean section. ?Risks discussed including infection, bleeding, damage to surrounding structures, the need for additional procedures including hysterectomy, and the possibility of uterine rupture with neonatal morbidity/mortality, scarring, and abnormal placentation with subsequent pregnancies. Patient agrees to proceed. 2g ancef on call to OR.  ? ? ?Rosie Fate MD ? ?

## 2022-02-25 NOTE — Progress Notes (Signed)
Called Dr. Elon Spanner for clarification of CBG orders. She asked for a fasting at 0500 and before meals. She gave parameters to call if we get blood sugars of 200 or greater twice.  ?

## 2022-02-25 NOTE — Op Note (Signed)
PROCEDURE DATE: 02/25/22 ?  ?PREOPERATIVE DIAGNOSIS: arrest of dilation, T2DM ?  ?POSTOPERATIVE DIAGNOSIS: The same and OP presentation ?  ?PROCEDURE:    Primary Low Transverse Cesarean Section ?  ?SURGEON:  Dr. Belva Agee ?  ?INDICATIONS: This is a 26 yo G2P0 at 76.6 wga requiring cesarean section secondary to arrest of dilation. She was induced s/s T2DM and progressed to 6cm where she arrested x 8 hours despite adequate MVUs x 6 hours.  ?Decision made to proceed with LTCS. The risks of cesarean section discussed with the patient included but were not limited to: bleeding which may require transfusion or reoperation; infection which may require antibiotics; injury to bowel, bladder, ureters or other surrounding organs; injury to the fetus; need for additional procedures including hysterectomy in the event of a life-threatening hemorrhage; placental abnormalities wth subsequent pregnancies, incisional problems, thromboembolic phenomenon and other postoperative/anesthesia complications. The patient agreed with the proposed plan, giving informed consent for the procedure.   ?  ?FINDINGS:  Viable female infant in OP presentation, APGARspending,  Weight pending, Amniotic fluid clear,  Intact placenta, three vessel cord.  Grossly normal uterus. ?.   ?ANESTHESIA:    Epidural ?ESTIMATED BLOOD LOSS: 505cc ?SPECIMENS: Placenta for routine ?COMPLICATIONS: None immediate ?  ?PROCEDURE IN DETAIL:  The patient received intravenous antibiotics (2g Ancef) and had sequential compression devices applied to her lower extremities while in the preoperative area.  She was then taken to the operating room where epidural anesthesia was dosed up to surgical level and was found to be adequate. She was then placed in a dorsal supine position with a leftward tilt, and prepped and draped in a sterile manner.  A foley catheter was placed into her bladder and attached to constant gravity.  After an adequate timeout was performed, a Pfannenstiel  skin incision was made with scalpel and carried through to the underlying layer of fascia. The fascia was incised in the midline and this incision was extended bilaterally using the Mayo scissors. Kocher clamps were applied to the superior aspect of the fascial incision and the underlying rectus muscles were dissected off bluntly. A similar process was carried out on the inferior aspect of the facial incision. The rectus muscles were separated in the midline bluntly and the peritoneum was entered bluntly.  A bladder flap was created sharply and developed bluntly. A transverse hysterotomy was made with a scalpel and extended bilaterally bluntly. The bladder blade was then removed. The infant was successfully delivered, and cord was clamped and cut and infant was handed over to awaiting neonatology team. Uterine massage was then administered and the placenta delivered intact with three-vessel cord. Cord gases were taken. The uterus was cleared of clot and debris.  The hysterotomy was closed with 0 vicryl.  A second imbricating suture of 0-vicryl was used to reinforce the incision and aid in hemostasis.The fascia was closed with 0-Vicryl in a running fashion with good restoration of anatomy.  The subcutaneus tissue was irrigated and was reapproximated using three interrupted plain gut stitches.  The skin was closed with 4-0 Vicryl in a subcuticular fashion. ? ?All surgical site and was hemostatic at end of procedure) without any further bleeding on exam.  ?  ?Pt tolerated the procedure well. All sponge/lap/needle counts were correct  X 2. Pt taken to recovery room in stable condition. ?  ?  ?Belva Agee MD ? ?

## 2022-02-25 NOTE — Anesthesia Postprocedure Evaluation (Signed)
Anesthesia Post Note ? ?Patient: Jordan Williams ? ?Procedure(s) Performed: CESAREAN SECTION ? ?  ? ?Patient location during evaluation: PACU ?Anesthesia Type: Epidural ?Level of consciousness: awake and alert ?Pain management: pain level controlled ?Vital Signs Assessment: post-procedure vital signs reviewed and stable ?Respiratory status: spontaneous breathing ?Cardiovascular status: stable ?Postop Assessment: epidural receding, no backache and no headache ?Anesthetic complications: no ? ? ?No notable events documented. ? ?Last Vitals:  ?Vitals:  ? 02/25/22 1930 02/25/22 1940  ?BP: 108/76 123/80  ?Pulse: 80 92  ?Resp: 16 20  ?Temp:  37.1 ?C  ?SpO2: 100% 100%  ?  ?Last Pain:  ?Vitals:  ? 02/25/22 1940  ?TempSrc: Oral  ?PainSc:   ? ? ?  ?  ?  ?  ?  ?  ? ?Lewie Loron ? ? ? ? ?

## 2022-02-25 NOTE — Progress Notes (Signed)
Patient reports painful contractions. She declines CLE.  ?SVE unchanged - 6/80/-2, caput still forming and is worse. IUPC placed with patient's consent after discussing in detail the process.  ?I recommend increasing pitocin given no cervical change. I discussed my concerns regarding her progress and the forming caput. However, FHT is reassuring and no s/s infection.  ?Thus, will monitor IUPC and re-evaluate in a few hours. ?She is in agreement with increasing the pitocin only if IUPC shows inadequate contractions.  ?Positional changes d/w pt. Open to peanut ball. ? ? ?Rosie Fate MD ?

## 2022-02-25 NOTE — Progress Notes (Addendum)
Doing well. Mild pain with contractions - not severe.  ?BS okay - some elevated after eating but mostly <120, none > 120 in a row. CTM closely.  ?Declines CLE. ?Foley balloon out, SVE 6/80/-2, some caput, AROM clear fluid. ?Will start pitocin and this was d/w pt in detail. She agrees.  ?

## 2022-02-26 ENCOUNTER — Encounter (HOSPITAL_COMMUNITY): Payer: Self-pay | Admitting: Obstetrics and Gynecology

## 2022-02-26 ENCOUNTER — Other Ambulatory Visit: Payer: Self-pay

## 2022-02-26 LAB — CBC
HCT: 29.2 % — ABNORMAL LOW (ref 36.0–46.0)
Hemoglobin: 10.1 g/dL — ABNORMAL LOW (ref 12.0–15.0)
MCH: 31.4 pg (ref 26.0–34.0)
MCHC: 34.6 g/dL (ref 30.0–36.0)
MCV: 90.7 fL (ref 80.0–100.0)
Platelets: 192 10*3/uL (ref 150–400)
RBC: 3.22 MIL/uL — ABNORMAL LOW (ref 3.87–5.11)
RDW: 13.1 % (ref 11.5–15.5)
WBC: 12.4 10*3/uL — ABNORMAL HIGH (ref 4.0–10.5)
nRBC: 0 % (ref 0.0–0.2)

## 2022-02-26 LAB — GLUCOSE, CAPILLARY
Glucose-Capillary: 89 mg/dL (ref 70–99)
Glucose-Capillary: 85 mg/dL (ref 70–99)
Glucose-Capillary: 70 mg/dL (ref 70–99)
Glucose-Capillary: 86 mg/dL (ref 70–99)

## 2022-02-26 NOTE — Clinical Social Work Maternal (Signed)
?CLINICAL SOCIAL WORK MATERNAL/CHILD NOTE ? ?Patient Details  ?Name: Jordan Williams ?MRN: [id number] ?Date of Birth: 07/05/1996 ? ?Date:  02/26/2022 ? ?Clinical Social Worker Initiating Note:  Arnell Slivinski, LCSW Date/Time: Initiated:  02/26/22/1000    ? ?Child's Name:  Kamryn Falletta  ? ?Biological Parents:  Mother, Father (Daniel Lawrence 04/09/1995)  ? ?Need for Interpreter:  None  ? ?Reason for Referral:  Behavioral Health Concerns  ? ?Address:  [address] ?[place] [address]  ?  ?Phone number:  [phone] (home)    ? ?Additional phone number:  ? ?Household Members/Support Persons (HM/SP):     ? ? ?HM/SP Name Relationship DOB or Age  ?HM/SP -1        ?HM/SP -2        ?HM/SP -3        ?HM/SP -4        ?HM/SP -5        ?HM/SP -6        ?HM/SP -7        ?HM/SP -8        ? ? ?Natural Supports (not living in the home):  Immediate Family  ? ?Professional Supports: Therapist ([facility])  ? ?Employment: Full-time  ? ?Type of Work: [employer]  ? ?Education:  College graduate  ? ?Homebound arranged:   ? ?Financial Resources:  Private Insurance   ? ?Other Resources:  WIC  ? ?Cultural/Religious Considerations Which May Impact Care:   ? ?Strengths:  Ability to meet basic needs  , Pediatrician chosen, Home prepared for child  , Psychotropic Medications  ? ?Psychotropic Medications:  Lamictal     ? ?Pediatrician:    [place] area ? ?Pediatrician List:  ? ?[facility]  ?[place]    ?[place]    ?[place]    ?[place]    ?[place]    ? ? ?Pediatrician Fax Number:   ? ?Risk Factors/Current Problems:  None  ? ?Cognitive State:  Able to Concentrate  , Goal Oriented  , Insightful  , Linear Thinking    ? ?Mood/Affect:  Calm  , Interested    ? ?CSW Assessment: CSW consulted for history of anxiety, depression and bipolar. CSW met with MOB to assess and provide support. CSW introduced self and role. CSW observed infant sleeping in bassinet.  CSW informed MOB of the reason for consult. MOB was engaged and open with CSW. MOB stated she currently lives alone and works full time. MOB is employed and receives WIC benefits. CSW inquired on MOB mood. MOB expressed she is currently feeling pretty good. MOB stated the pregnancy was easy, however it was also emotional. MOB shared that her emotions ranged between crying, upset or content. CSW asked MOB how she coped with the feelings. MOB reported she has been seeing a counselor with [facility] weekly, for two to three years. MOB disclosed that she is also prescribed Lamictal 150mg to manage her bipolar diagnosis. MOB identified both interventions as being beneficial. MOB stated she was diagnosed with bipolar at 20. MOB shared she was previously diagnosed with depression and prescribed Serotonin, however she no longer takes the medication. MOB denies any AH or VH. MOB denies SI, HI, and being involved in domestic violence. MOB identified her sister as a primary support postpartum.  ? ?CSW provided education regarding the baby blues period versus PPD and provided mental health resources. CSW provided the New Mom Checklist and discussed PPD   symptoms. MOB reported she is comfortable contacting a professional if needs arise.  ?CSW provided review of Sudden Infant Death Syndrome (SIDS) precautions. MOB stated she has a crib, car seat and all infant essentials. MOB denies any transportation barriers to infant follow-up care. MOB requested resources for rental assistance, which CSW provided, and was receptive to a referral for [facility]. MOB reported no additional needs at this time.  ? ?CSW identifies no further need for intervention and no barriers to discharge at this time. ? ?CSW Plan/Description:  No Further Intervention Required/No Barriers to Discharge, Perinatal Mood and Anxiety Disorder (PMADs) Education, Sudden Infant Death Syndrome (SIDS) Education, Other Information/Referral to Community  Resources, Other Patient/Family Education  ? ? ?Treva Huyett J Rojelio Uhrich, LCSWA ?02/26/2022, 11:06 AM ? ?

## 2022-02-26 NOTE — Lactation Note (Signed)
This note was copied from a baby's chart. ?Lactation Consultation Note ? ?Patient Name: Jordan Williams ?Today's Date: 02/26/2022 ?Reason for consult: Follow-up assessment;1st time breastfeeding;Infant weight loss (-1% weight loss) ?Age:24 hours ?Per mom, she feels breast feeding is going well, per mom, infant doesn't BF as well on her right breast and would like latch infant on her right breast.  ?Infant had medium size stool prior to being latched at the breast. ?Mom latched infant on her right breast using the football hold position, infant latched well with depth ,sustaining  latch during the feeding, infant BF for 20 minutes. ?Mom has self hand expressed 3 times, mom expressed 5 mls of colostrum and spoon feed to infant. ?LC reviewed infant's small tummy size and changes that occur with the first week of life, mom happy to see she does have colostrum to give infant. ?LC discussed with mom, infant may cluster feed to night, this is normal pattern of behavior, mom will try latch infant on both breast during a feeding. ?Mom will continue to breastfeed infant according to hunger cues, 8 to 12+ or more times within 24 hours, skin to skin. ?Mom knows to call RN/LC if she has any BF questions, concerns or need further assistance with latching infant at the breast.  ? ?Maternal Data ?Has patient been taught Hand Expression?: Yes ?Does the patient have breastfeeding experience prior to this delivery?: No ? ?Feeding ?Mother's Current Feeding Choice: Breast Milk ? ?LATCH Score ?Latch: Grasps breast easily, tongue down, lips flanged, rhythmical sucking. ? ?Audible Swallowing: Spontaneous and intermittent ? ?Type of Nipple: Everted at rest and after stimulation ? ?Comfort (Breast/Nipple): Soft / non-tender ? ?Hold (Positioning): Assistance needed to correctly position infant at breast and maintain latch. ? ?LATCH Score: 9 ? ? ?Lactation Tools Discussed/Used ?  ? ?Interventions ?Interventions: Assisted with latch;Skin to  skin;Breast compression;Adjust position;Support pillows;Position options;Expressed milk;Education ? ?Discharge ?  ? ?Consult Status ?Consult Status: Follow-up ?Date: 02/27/22 ?Follow-up type: In-patient ? ? ? ?Danelle Earthly ?02/26/2022, 4:48 PM ? ? ? ?

## 2022-02-26 NOTE — Progress Notes (Signed)
Patient declined scheduled pain medications. RN educated about pain medications and gas medication. Patient will call RN if requesting pain medications. Patient did accept warm compress for pain. ? ?Foley removed at 0930. Patient tried to void at 1230 but unable to go. Will try again. ? ?Dressing changed per MD order. Steri strips intact underneath.  ? ? ?

## 2022-02-26 NOTE — Lactation Note (Signed)
This note was copied from a baby's chart. ?Lactation Consultation Note ?Mom has been latching well per RN. ?Mom latching baby in cradle position. LC taught cross cradle and football hold. ?Mom has good compressible everted nipples w/easily expressed colostrum. ?Noted milk transfer. Baby BF well. Relaxed after feeding. ?Newborn feeding habits, behavior, STS, I&O, positioning, supply and demand discussed. ?Answered mom's questions that she had. ?Encouraged mom to call for questions or concerns. ? ?Patient Name: Jordan Williams ?Today's Date: 02/26/2022 ?Reason for consult: Initial assessment;Primapara;Early term 37-38.6wks;Maternal endocrine disorder ?Age:23 hours ? ?Maternal Data ?Has patient been taught Hand Expression?: Yes ?Does the patient have breastfeeding experience prior to this delivery?: No ? ?Feeding ?  ? ?LATCH Score ?Latch: Grasps breast easily, tongue down, lips flanged, rhythmical sucking. ? ?Audible Swallowing: A few with stimulation ? ?Type of Nipple: Everted at rest and after stimulation ? ?Comfort (Breast/Nipple): Soft / non-tender ? ?Hold (Positioning): Assistance needed to correctly position infant at breast and maintain latch. ? ?LATCH Score: 8 ? ? ?Lactation Tools Discussed/Used ?  ? ?Interventions ?  ? ?Discharge ?  ? ?Consult Status ?Consult Status: Follow-up ?Date: 02/26/22 ?Follow-up type: In-patient ? ? ? ?Charyl Dancer ?02/26/2022, 2:50 AM ? ? ? ?

## 2022-02-26 NOTE — Progress Notes (Signed)
POD # 1  ?Doing well. ?No complaints. ?BP 105/69 (BP Location: Left Arm)   Pulse 88   Temp 98.4 ?F (36.9 ?C) (Oral)   Resp 20   Ht 5\' 4"  (1.626 m)   Wt 105.2 kg   LMP 05/29/2021 (Exact Date)   SpO2 98%   Breastfeeding Unknown   BMI 39.82 kg/m?  ?Results for orders placed or performed during the hospital encounter of 02/24/22 (from the past 24 hour(s))  ?Glucose, capillary     Status: None  ? Collection Time: 02/25/22  9:46 AM  ?Result Value Ref Range  ? Glucose-Capillary 85 70 - 99 mg/dL  ?Glucose, capillary     Status: None  ? Collection Time: 02/25/22 11:51 AM  ?Result Value Ref Range  ? Glucose-Capillary 85 70 - 99 mg/dL  ?Glucose, capillary     Status: Abnormal  ? Collection Time: 02/25/22  2:07 PM  ?Result Value Ref Range  ? Glucose-Capillary 65 (L) 70 - 99 mg/dL  ?Glucose, capillary     Status: Abnormal  ? Collection Time: 02/25/22  3:58 PM  ?Result Value Ref Range  ? Glucose-Capillary 57 (L) 70 - 99 mg/dL  ?Glucose, capillary     Status: None  ? Collection Time: 02/25/22  6:37 PM  ?Result Value Ref Range  ? Glucose-Capillary 83 70 - 99 mg/dL  ?Glucose, capillary     Status: None  ? Collection Time: 02/25/22  7:21 PM  ?Result Value Ref Range  ? Glucose-Capillary 84 70 - 99 mg/dL  ?Glucose, capillary     Status: Abnormal  ? Collection Time: 02/25/22  9:19 PM  ?Result Value Ref Range  ? Glucose-Capillary 103 (H) 70 - 99 mg/dL  ?Glucose, capillary     Status: None  ? Collection Time: 02/26/22  4:16 AM  ?Result Value Ref Range  ? Glucose-Capillary 89 70 - 99 mg/dL  ?CBC     Status: Abnormal  ? Collection Time: 02/26/22  4:18 AM  ?Result Value Ref Range  ? WBC 12.4 (H) 4.0 - 10.5 K/uL  ? RBC 3.22 (L) 3.87 - 5.11 MIL/uL  ? Hemoglobin 10.1 (L) 12.0 - 15.0 g/dL  ? HCT 29.2 (L) 36.0 - 46.0 %  ? MCV 90.7 80.0 - 100.0 fL  ? MCH 31.4 26.0 - 34.0 pg  ? MCHC 34.6 30.0 - 36.0 g/dL  ? RDW 13.1 11.5 - 15.5 %  ? Platelets 192 150 - 400 K/uL  ? nRBC 0.0 0.0 - 0.2 %  ?Pressure dressing removed  ?Honeycomb is a little  loose at the bottom  ?Uterus is non tender ? ?POD # 1  ?C Section ?T2 DM ?Doing well ?Continue routine care and metformin  ?Discharge home tomorrow ?Circ tomorrow per nursery staff ?

## 2022-02-27 LAB — GLUCOSE, CAPILLARY
Glucose-Capillary: 78 mg/dL (ref 70–99)
Glucose-Capillary: 84 mg/dL (ref 70–99)
Glucose-Capillary: 98 mg/dL (ref 70–99)
Glucose-Capillary: 106 mg/dL — ABNORMAL HIGH (ref 70–99)

## 2022-02-27 NOTE — Progress Notes (Signed)
Postpartum Progress Note ? ?Postpartum Day 2 s/p primary Cesarean section. ? ?Subjective: ? ?Patient reports no overnight events.  She reports well controlled pain, ambulating without difficulty, voiding spontaneously, tolerating PO.  She reports Negative flatus, Negative BM.  Vaginal bleeding is appropriate. ? ?Objective: ?Blood pressure 117/75, pulse (!) 102, temperature 99 ?F (37.2 ?C), temperature source Oral, resp. rate 20, height 5\' 4"  (1.626 m), weight 105.2 kg, last menstrual period 05/29/2021, SpO2 100 %, unknown if currently breastfeeding. ? ?Physical Exam:  ?General: alert and no distress ?Lochia: appropriate ?Uterine Fundus: firm ?Incision: dressing in place ?DVT Evaluation: No evidence of DVT seen on physical exam. ? ?Recent Labs  ?  02/24/22 ?1803 02/26/22 ?0418  ?HGB 13.6 10.1*  ?HCT 40.7 29.2*  ? ? ?Assessment/Plan: ?Postpartum Day 2, s/p C-section for arrest of dilation ?T2DM - on metformin, no insulin. BG well controlled postpartum. Continue metformin, follow up with endocrinologist outpatient.  ?Baby boy - desires circ, will perform when cleared by nursery ?Lactation following ?Doing well, continue routine postpartum care. Anticipate discharge home tomorrow ? ? LOS: 3 days  ? ?04/28/22 ?02/27/2022, 7:26 AM  ? ? ?

## 2022-02-28 ENCOUNTER — Encounter (HOSPITAL_COMMUNITY): Payer: Self-pay | Admitting: Obstetrics and Gynecology

## 2022-02-28 MED ORDER — IBUPROFEN 600 MG PO TABS: 600.0000 mg | ORAL_TABLET | Freq: Four times a day (QID) | ORAL | 0 refills | Status: DC | PRN

## 2022-02-28 NOTE — Lactation Note (Signed)
This note was copied from a baby's chart. ?Lactation Consultation Note ? ?Patient Name: Jordan Williams ?Today's Date: 02/28/2022 ?Reason for consult: Follow-up assessment;Mother's request;Early term 37-38.6wks;1st time breastfeeding, mom with hx PCOS see mom's MR. ?Age:24 hours ?P1, ETI female infant with -6% weight loss. ?Per mom, infant was circumcised earlier today. ?Mom had changed stool diaper that was [place] in color.  ?Per mom, she has only been latching infant on one  breast during a feeding and not both, infant will sleep at the breast. ?LC discussed breast stimulation techniques with mom to keep infant awake while breastfeeding such as : BF infant skin to skin, breast compressions and gently stroking infant's neck and shoulder.  ?Mom used breast compression and undress infant doing skin to skin for current feeding, mom latched infant on her left breast using the football hold position, infant latched with depth, swallows observed infant BF for 15 minutes. ?Mom self expressed 2 mls of colostrum that was spoon feed to infant, infant was still cuing to BF and mom re-latching infant on her right breast using the football hold position, infant was still BF when Jordan Williams left the room, at 20 minutes. ?Mom's plan: ?1- Mom will continue to breastfeed infant according to feeding cues, 8 to 12+ or more times within 24 hours, skin to skin. ?2- Mom will start latching infant on both breast during a feeding doing breast stimulation techniques to keep infant actively BF and not sleeping at the breast.  ?3- Mom knows she can hand express and give infant extra volume of breast milk after latching infant at the breast. ?4- Mom knows to call RN/LC if she has breastfeeding questions, concerns or need further assistance with latching infant at the breast.  ?Maternal Data ?  ? ?Feeding ?Mother's Current Feeding Choice: Breast Milk ? ?LATCH Score ?Latch: Grasps breast easily, tongue down, lips flanged, rhythmical sucking. ? ?Audible  Swallowing: Spontaneous and intermittent ? ?Type of Nipple: Everted at rest and after stimulation ? ?Comfort (Breast/Nipple): Soft / non-tender ? ?Hold (Positioning): Assistance needed to correctly position infant at breast and maintain latch. ? ?LATCH Score: 9 ? ? ?Lactation Tools Discussed/Used ?  ? ?Interventions ?Interventions: Assisted with latch;Skin to skin;Hand express;Breast compression;Adjust position;Support pillows;Position options;Expressed milk;Education ? ?Discharge ?  ? ?Consult Status ?Consult Status: Follow-up ?Date: 02/28/22 ?Follow-up type: In-patient ? ? ? ?Jordan Williams ?02/28/2022, 2:26 AM ? ? ? ?

## 2022-02-28 NOTE — Discharge Summary (Signed)
? ?  Postpartum Discharge Summary ? ? ?   ?Patient Name: Jordan Williams ?DOB: [birth year] ?MRN: [id number] ? ?Date of admission: 02/24/2022 ?Delivery date:02/25/2022  ?Delivering provider: Lucillie Garfinkel JENNIFER  ?Date of discharge: 02/28/2022 ? ?Admitting diagnosis: Pregnancy [Z34.90] ?Intrauterine pregnancy: [redacted]w[redacted]d    ?Secondary diagnosis:  Principal Problem: ?  Pregnancy ? ?Additional problems: AODM    ?Discharge diagnosis: Term Pregnancy Delivered and AODM                                               ?Post partum procedures:   ?Augmentation: AROM, Pitocin, and OP Foley ?Complications: None ? ?Hospital course: Induction of Labor With Cesarean Section   ?26y.o. yo G2P1011 at 359w6das admitted to the hospital 02/24/2022 for induction of labor. Patient had a labor course significant for arrest of dilation. The patient went for cesarean section due to Arrest of Dilation. Delivery details are as follows: ?Membrane Rupture Time/Date: 7:26 AM ,02/25/2022   ?Delivery Method:C-Section, Low Transverse  ?Details of operation can be found in separate operative Note.  Patient had an uncomplicated postpartum course. She is ambulating, tolerating a regular diet, passing flatus, and urinating well.  Patient is discharged home in stable condition on 02/28/22.     ? ?Newborn Data: ?Birth date:02/25/2022  ?Birth time:5:37 PM  ?Gender:Female  ?Living status:Living  ?Apgars:9 ,9  ?Weight:3220 g                               ? ?Magnesium Sulfate received: No ?BMZ received: No ?Rhophylac:N/A ?MMR:N/A ?T-DaP:Given prenatally ?Flu: N/A ?Transfusion:No ? ?Physical exam  ?Vitals:  ? 02/27/22 0514 02/27/22 1626 02/27/22 2148 02/28/22 0508  ?BP: 117/75 113/76 121/77 113/69  ?Pulse: (!) 102 (!) 101 82 78  ?Resp: _0 ?Temp: 99 ?F (37.2 ?C) 98.1 ?F (36.7 ?C) 97.9 ?F (36.6 ?C) 98 ?F (36.7 ?C)  ?TempSrc: Oral Oral Oral Oral  ?SpO2: 100% 100%  98%  ?Weight:      ?Height:      ? ?General: alert, cooperative, and no distress ?Lochia: appropriate ?Uterine  Fundus: firm ?Incision: Healing well with no significant drainage ?DVT Evaluation: No evidence of DVT seen on physical exam. ?Labs: ?Lab Results  ?Component Value Date  ? WBC 12.4 (H) 02/26/2022  ? HGB 10.1 (L) 02/26/2022  ? HCT 29.2 (L) 02/26/2022  ? MCV 90.7 02/26/2022  ? PLT 192 02/26/2022  ? ?CMP Latest Ref Rng & Units 07/11/2021  ?Glucose 70 - 99 mg/dL 110(H)  ?BUN 6 - 20 mg/dL 6  ?Creatinine 0.44 - 1.00 mg/dL 0.83  ?Sodium 135 - 145 mmol/L 138  ?Potassium 3.5 - 5.1 mmol/L 3.6  ?Chloride 98 - 111 mmol/L 108  ?CO2 22 - 32 mmol/L 23  ?Calcium 8.9 - 10.3 mg/dL 9.3  ?Total Protein 6.5 - 8.1 g/dL 6.7  ?Total Bilirubin 0.3 - 1.2 mg/dL 0.7  ?Alkaline Phos 38 - 126 U/L 41  ?AST 15 - 41 U/L 34  ?ALT 0 - 44 U/L 57(H)  ? ?Edinburgh Score: ?Edinburgh Postnatal Depression Scale Screening Tool 02/25/2022  ?I have been able to laugh and see the funny side of things. 0  ?I have looked forward with enjoyment to things. 0  ?I have blamed myself unnecessarily when things went wrong. 1  ?I have  been anxious or worried for no good reason. 1  ?I have felt scared or panicky for no good reason. 0  ?Things have been getting on top of me. 0  ?I have been so unhappy that I have had difficulty sleeping. 0  ?I have felt sad or miserable. 0  ?I have been so unhappy that I have been crying. 0  ?The thought of harming myself has occurred to me. 0  ?Edinburgh Postnatal Depression Scale Total 2  ? ? ? ? ?After visit meds:  ?Allergies as of 02/28/2022   ?No Known Allergies ?  ? ?  ?Medication List  ?  ? ?STOP taking these medications   ? ?terconazole 0.4 % vaginal cream ?Commonly known as: TERAZOL 7 ?  ? ?  ? ?TAKE these medications   ? ?ibuprofen 600 MG tablet ?Commonly known as: ADVIL ?Take 1 tablet (600 mg total) by mouth every 6 (six) hours as needed for mild pain, moderate pain or cramping. ?  ?lamoTRIgine 25 MG tablet ?Commonly known as: LAMICTAL ?Take 150 mg by mouth at bedtime. ?  ?metFORMIN 1000 MG tablet ?Commonly known as: GLUCOPHAGE ?Take  1 tablet (1,000 mg total) by mouth 2 (two) times daily with a meal. ?  ?prenatal multivitamin Tabs tablet ?Take 1 tablet by mouth daily at 12 noon. ?  ? ?  ? ? ? ?Discharge home in stable condition ?Infant Feeding: Breast ?Infant Disposition:home with mother ?Discharge instruction: per After Visit Summary and Postpartum booklet. ?Activity: Advance as tolerated. Pelvic rest for 6 weeks.  ?Diet: carb modified diet ?Anticipated Birth Control: Unsure ?Postpartum Appointment:6 weeks ?Additional Postpartum F/U:  ?Future Appointments:No future appointments. ?Follow up Visit: ? ? ?  ? ?02/28/2022 ?Luz Lex, MD ? ? ?

## 2022-02-28 NOTE — Lactation Note (Signed)
This note was copied from a baby's chart. ?Lactation Consultation Note ? ?Patient Name: Jordan Williams ?Today's Date: 02/28/2022 ?Reason for consult: Follow-up assessment;Primapara;1st time breastfeeding;Early term 37-38.6wks;Infant weight loss;Other (Comment);Breastfeeding assistance (8 % weight loss/ milk is in bilaterally) ?Age:24 hours ?Mom latched the baby to start and LC noted it to be shallow and assisted to correct the latch and increase the depth. Multiple swallows noted / and per mom more comfortable. Latch score 8 .  ?[place] reviewed Breast feeding teaching for D/C and reviewed the importance of feeding with cues and by 3 hours due to weight loss.  ?If to full to start release fullness down so the areola is more compressible for a deeper latch. If the baby only feeds 1st breast / release the 2nd breast down to comfort .  ?Mom aware of the [facility] resources after D/C,  ? ?Maternal Data ?Has patient been taught Hand Expression?: Yes ? ?Feeding ?Mother's Current Feeding Choice: Breast Milk ? ?LATCH Score ?Latch: Grasps breast easily, tongue down, lips flanged, rhythmical sucking. ? ?Audible Swallowing: Spontaneous and intermittent ? ?Type of Nipple: Everted at rest and after stimulation ? ?Comfort (Breast/Nipple): Filling, red/small blisters or bruises, mild/mod discomfort ? ?Hold (Positioning): Assistance needed to correctly position infant at breast and maintain latch. ? ?LATCH Score: 8 ? ? ?Lactation Tools Discussed/Used ?Tools: Pump;Flanges ?Flange Size: 24 ?Breast pump type: Manual ?Pump Education: Milk Storage ?Pumped volume: 40 mL ? ?Interventions ?Interventions: Breast feeding basics reviewed;Assisted with latch;Skin to skin;Breast compression;Adjust position;Position options;Hand pump;Education;LC Services brochure ? ?Discharge ?Discharge Education: Engorgement and breast care;Warning signs for feeding baby ?Pump: Manual ? ?Consult Status ?Consult Status: Complete ?Date: 02/28/22 ? ? ? ?Jordan Williams  Jordan Williams ?02/28/2022, 11:55 AM ? ? ? ?

## 2022-03-05 ENCOUNTER — Inpatient Hospital Stay (HOSPITAL_COMMUNITY)
Admission: AD | Admit: 2022-03-05 | Discharge: 2022-03-05 | Disposition: A | Discharge status: Home / Self Care | Payer: BC Managed Care – PPO | Attending: Obstetrics and Gynecology | Admitting: Obstetrics and Gynecology

## 2022-03-05 ENCOUNTER — Other Ambulatory Visit: Payer: Self-pay

## 2022-03-05 ENCOUNTER — Encounter (HOSPITAL_COMMUNITY): Payer: Self-pay | Admitting: Obstetrics and Gynecology

## 2022-03-05 DIAGNOSIS — O9122 Nonpurulent mastitis associated with the puerperium: Secondary | ICD-10-CM | POA: Diagnosis not present

## 2022-03-05 DIAGNOSIS — R109 Unspecified abdominal pain: Secondary | ICD-10-CM | POA: Diagnosis not present

## 2022-03-05 DIAGNOSIS — Z98891 History of uterine scar from previous surgery: Secondary | ICD-10-CM

## 2022-03-05 LAB — CBC WITH DIFFERENTIAL/PLATELET
Abs Immature Granulocytes: 0.04 10*3/uL (ref 0.00–0.07)
Basophils Absolute: 0 10*3/uL (ref 0.0–0.1)
Basophils Relative: 0 %
Eosinophils Absolute: 0.1 10*3/uL (ref 0.0–0.5)
Eosinophils Relative: 2 %
HCT: 29.9 % — ABNORMAL LOW (ref 36.0–46.0)
Hemoglobin: 9.8 g/dL — ABNORMAL LOW (ref 12.0–15.0)
Immature Granulocytes: 1 %
Lymphocytes Relative: 24 %
Lymphs Abs: 2.1 10*3/uL (ref 0.7–4.0)
MCH: 30.6 pg (ref 26.0–34.0)
MCHC: 32.8 g/dL (ref 30.0–36.0)
MCV: 93.4 fL (ref 80.0–100.0)
Monocytes Absolute: 0.7 10*3/uL (ref 0.1–1.0)
Monocytes Relative: 8 %
Neutro Abs: 5.8 10*3/uL (ref 1.7–7.7)
Neutrophils Relative %: 65 %
Platelets: 364 10*3/uL (ref 150–400)
RBC: 3.2 MIL/uL — ABNORMAL LOW (ref 3.87–5.11)
RDW: 13.1 % (ref 11.5–15.5)
WBC: 8.8 10*3/uL (ref 4.0–10.5)
nRBC: 0 % (ref 0.0–0.2)

## 2022-03-05 LAB — LACTIC ACID, PLASMA: Lactic Acid, Venous: 0.7 mmol/L (ref 0.5–1.9)

## 2022-03-05 LAB — COMPREHENSIVE METABOLIC PANEL
ALT: 26 U/L (ref 0–44)
AST: 21 U/L (ref 15–41)
Albumin: 3 g/dL — ABNORMAL LOW (ref 3.5–5.0)
Alkaline Phosphatase: 105 U/L (ref 38–126)
Anion gap: 10 (ref 5–15)
BUN: 7 mg/dL (ref 6–20)
CO2: 22 mmol/L (ref 22–32)
Calcium: 8.6 mg/dL — ABNORMAL LOW (ref 8.9–10.3)
Chloride: 106 mmol/L (ref 98–111)
Creatinine, Ser: 0.78 mg/dL (ref 0.44–1.00)
GFR, Estimated: >60 mL/min (ref >60)
Glucose, Bld: 83 mg/dL (ref 70–99)
Potassium: 3.7 mmol/L (ref 3.5–5.1)
Sodium: 138 mmol/L (ref 135–145)
Total Bilirubin: 0.4 mg/dL (ref 0.3–1.2)
Total Protein: 6.3 g/dL — ABNORMAL LOW (ref 6.5–8.1)

## 2022-03-05 MED ORDER — KETOROLAC TROMETHAMINE 10 MG PO TABS
10.0000 mg | ORAL_TABLET | Freq: Four times a day (QID) | ORAL | 0 refills | Status: AC | PRN
Start: 2022-03-05 — End: 2022-04-04

## 2022-03-05 MED ORDER — ACETAMINOPHEN 500 MG PO TABS
1000.0000 mg | ORAL_TABLET | Freq: Once | ORAL | Status: AC
  Administered 2022-03-05: 1000 mg via ORAL
  Filled 2022-03-05: qty 2

## 2022-03-05 MED ORDER — DICLOXACILLIN SODIUM 500 MG PO CAPS
500.0000 mg | ORAL_CAPSULE | Freq: Four times a day (QID) | ORAL | 0 refills | Status: DC
Start: 2022-03-05 — End: 2022-03-30

## 2022-03-05 MED ORDER — KETOROLAC TROMETHAMINE 10 MG PO TABS
10.0000 mg | ORAL_TABLET | Freq: Once | ORAL | Status: AC
  Administered 2022-03-05: 10 mg via ORAL
  Filled 2022-03-05: qty 1

## 2022-03-05 NOTE — MAU Provider Note (Signed)
?History  ?  ? ?CSN: [id number] ? ?Arrival date and time: 03/05/22 0034 ? ? Event Date/Time  ? First Provider Initiated Contact with Patient 03/05/22 0057   ?  ? ?Chief Complaint  ?Patient presents with  ? Wound Check  ? ?HPI ?Jordan Williams is a 26 y.o. G2P1011 at [redacted]w[redacted]d who presents to MAU for evaluation of her abdominal incision. She is s/p primary cesarean on 02/25/2022. Patient states that around 2330 last night she noted that the left margin of her abdominal incision was slightly open. She reports mild abdominal pain "when I'm pulling my pants up and down my abdomen". Pain score 2/10. She denies bleeding from her incision. She denies foul-smelling discharge, ecchymosis, erythema. She denies fever. ? ?Patient endorses bilateral plugged ducts 2/2 exclusively breastfeeding. She states her nipples cannot tolerate use of an electric pump so she is hand pumping x 1 hour every 2 hours, then feeling her baby expressed milk. She states her breasts continue to feel full with palpable lumps when she is finished pumping. ? ?Of note, patient has not taken any pain medicine since her discharge from the hospital. She states she asked to avoid narcotics in the postpartum period. She most recently took Motrin on 03/01/2022. ? ?Patient received prenatal care with [facility]. She is scheduled to visit [place] IB[facility] team on Thursday 03/07/2022. ? ?OB History   ? ? Gravida  ?2  ? Para  ?1  ? Term  ?1  ? Preterm  ?0  ? AB  ?1  ? Living  ?1  ?  ? ? SAB  ?1  ? IAB  ?0  ? Ectopic  ?0  ? Multiple  ?0  ? Live Births  ?1  ?   ?  ?  ? ? ?Past Medical History:  ?Diagnosis Date  ? Anxiety   ? Asthma   ? when young, no meds currently  ? Bipolar 1 disorder (HCC)   ? Depression   ? PCOS (polycystic ovarian syndrome)   ? Trichomoniasis   ? Type 2 diabetes mellitus (HCC)   ? ? ?Past Surgical History:  ?Procedure Laterality Date  ? CESAREAN SECTION N/A 02/25/2022  ? Procedure: CESAREAN SECTION;  Surgeon: Ranae Pila, MD;   Location: [facility] LD ORS;  Service: Obstetrics;  Laterality: N/A;  ? NO PAST SURGERIES    ? ? ?Family History  ?Problem Relation Age of Onset  ? Healthy Mother   ? Healthy Father   ? ? ?Social History  ? ?Tobacco Use  ? Smoking status: Never  ? Smokeless tobacco: Never  ?Vaping Use  ? Vaping Use: Never used  ?Substance Use Topics  ? Alcohol use: Not Currently  ?  Comment: occ  ? Drug use: Never  ? ? ?Allergies: No Known Allergies ? ?Medications Prior to Admission  ?Medication Sig Dispense Refill Last Dose  ? ibuprofen (ADVIL) 600 MG tablet Take 1 tablet (600 mg total) by mouth every 6 (six) hours as needed for mild pain, moderate pain or cramping. 30 tablet 0 03/04/2022  ? lamoTRIgine (LAMICTAL) 25 MG tablet Take 150 mg by mouth at bedtime.   03/04/2022  ? metFORMIN (GLUCOPHAGE) 1000 MG tablet Take 1 tablet (1,000 mg total) by mouth 2 (two) times daily with a meal. 90 tablet 1 03/04/2022  ? Prenatal Vit-Fe Fumarate-FA (PRENATAL MULTIVITAMIN) TABS tablet Take 1 tablet by mouth daily at 12 noon.   03/04/2022  ? ? ?Review of Systems  ?Gastrointestinal:  Positive for abdominal  pain.  ?All other systems reviewed and are negative. ?Physical Exam  ? ?Blood pressure 128/82, pulse (!) 105, temperature (!) 100.7 ?F (38.2 ?C), resp. rate 20, height 5\' 4"  (1.626 m), weight 101.3 kg, last menstrual period 05/29/2021, unknown if currently breastfeeding. ? ?Physical Exam ?Vitals and nursing note reviewed. Exam conducted with a chaperone present.  ?Constitutional:   ?   Appearance: Normal appearance. She is not ill-appearing.  ?Cardiovascular:  ?   Rate and Rhythm: Normal rate and regular rhythm.  ?   Pulses: Normal pulses.  ?   Heart sounds: Normal heart sounds.  ?Pulmonary:  ?   Effort: Pulmonary effort is normal.  ?   Breath sounds: Normal breath sounds.  ?Abdominal:  ?   Tenderness: There is abdominal tenderness. There is no right CVA tenderness or left CVA tenderness.  ?   Comments: Generalized abdominal tenderness appropriate for 8  days post-op, remote from pain medication.  ?Skin: ?   General: Skin is warm and dry.  ?   Capillary Refill: Capillary refill takes less than 2 seconds.  ?Neurological:  ?   Mental Status: She is alert and oriented to person, place, and time.  ?Psychiatric:     ?   Mood and Affect: Mood normal.     ?   Behavior: Behavior normal.     ?   Thought Content: Thought content normal.     ?   Judgment: Judgment normal.  ? ?Low abdominal incision is clean, dry, well-approximated. No drainage, bruising or discharge.  No tunneling. ? ? ?MAU Course  ?Procedures ? ?MDM ?--Patient with low grade fever, mildly tachycardic, palpable bilateral clogged ducts in breasts. Likely mastitis but will collect labs to confirm ? ?--Reviewed pathophysiology of mastitis. [facility] will likely have great advice for reconsidering use of electric pump so patient doesn't feel as if she is constantly tied to hand pump ? ?Patient Vitals for the past 24 hrs: ? BP Temp Pulse Resp Height Weight  ?03/05/22 0239 -- 98.8 ?F (37.1 ?C) -- -- -- --  ?03/05/22 0047 128/82 (!) 100.7 ?F (38.2 ?C) (!) 105 20 5\' 4"  (1.626 m) 101.3 kg  ? ?Orders Placed This Encounter  ?Procedures  ? CBC with Differential/Platelet  ? Lactic acid, plasma  ? Comprehensive metabolic panel  ? Discharge patient  ? ?Results for orders placed or performed during the hospital encounter of 03/05/22 (from the past 24 hour(s))  ?CBC with Differential/Platelet     Status: Abnormal  ? Collection Time: 03/05/22  1:19 AM  ?Result Value Ref Range  ? WBC 8.8 4.0 - 10.5 K/uL  ? RBC 3.20 (L) 3.87 - 5.11 MIL/uL  ? Hemoglobin 9.8 (L) 12.0 - 15.0 g/dL  ? HCT 29.9 (L) 36.0 - 46.0 %  ? MCV 93.4 80.0 - 100.0 fL  ? MCH 30.6 26.0 - 34.0 pg  ? MCHC 32.8 30.0 - 36.0 g/dL  ? RDW 13.1 11.5 - 15.5 %  ? Platelets 364 150 - 400 K/uL  ? nRBC 0.0 0.0 - 0.2 %  ? Neutrophils Relative % 65 %  ? Neutro Abs 5.8 1.7 - 7.7 K/uL  ? Lymphocytes Relative 24 %  ? Lymphs Abs 2.1 0.7 - 4.0 K/uL  ? Monocytes Relative 8 %  ?  Monocytes Absolute 0.7 0.1 - 1.0 K/uL  ? Eosinophils Relative 2 %  ? Eosinophils Absolute 0.1 0.0 - 0.5 K/uL  ? Basophils Relative 0 %  ? Basophils Absolute 0.0 0.0 -  0.1 K/uL  ? Immature Granulocytes 1 %  ? Abs Immature Granulocytes 0.04 0.00 - 0.07 K/uL  ?Lactic acid, plasma     Status: None  ? Collection Time: 03/05/22  1:19 AM  ?Result Value Ref Range  ? Lactic Acid, Venous 0.7 0.5 - 1.9 mmol/L  ?Comprehensive metabolic panel     Status: Abnormal  ? Collection Time: 03/05/22  1:32 AM  ?Result Value Ref Range  ? Sodium 138 135 - 145 mmol/L  ? Potassium 3.7 3.5 - 5.1 mmol/L  ? Chloride 106 98 - 111 mmol/L  ? CO2 22 22 - 32 mmol/L  ? Glucose, Bld 83 70 - 99 mg/dL  ? BUN 7 6 - 20 mg/dL  ? Creatinine, Ser 0.78 0.44 - 1.00 mg/dL  ? Calcium 8.6 (L) 8.9 - 10.3 mg/dL  ? Total Protein 6.3 (L) 6.5 - 8.1 g/dL  ? Albumin 3.0 (L) 3.5 - 5.0 g/dL  ? AST 21 15 - 41 U/L  ? ALT 26 0 - 44 U/L  ? Alkaline Phosphatase 105 38 - 126 U/L  ? Total Bilirubin 0.4 0.3 - 1.2 mg/dL  ? GFR, Estimated >60 >60 mL/min  ? Anion gap 10 5 - 15  ? ?Meds ordered this encounter  ?Medications  ? ketorolac (TORADOL) tablet 10 mg  ? acetaminophen (TYLENOL) tablet 1,000 mg  ? ketorolac (TORADOL) 10 MG tablet  ?  Sig: Take 1 tablet (10 mg total) by mouth every 6 (six) hours as needed.  ?  Dispense:  120 tablet  ?  Refill:  0  ?  Order Specific Question:   Supervising Provider  ?  Answer:   Warden Fillers [[id number]]  ? dicloxacillin (DYNAPEN) 500 MG capsule  ?  Sig: Take 1 capsule (500 mg total) by mouth 4 (four) times daily.  ?  Dispense:  40 capsule  ?  Refill:  0  ?  Order Specific Question:   Supervising Provider  ?  Answer:   Warden Fillers [[id number]]  ? ? ?Assessment and Plan  ?--26 y.o. G2P1011 s/p primary cesarean on 02/25/2022 ?--Incision healing appropriately ?--Mastitis, rx to pharmacy ?--Normal WBCs, lactic acid and CMET ?--Low grade fever responsive to Tylenol, continue PRN ?--D/C Motrin, given short course Toradol for discomfort ?--Discharge  home in stable condition ? ?Calvert Cantor, CNM ?03/05/2022, 2:55 AM  ?

## 2022-03-05 NOTE — MAU Note (Signed)
PT SAYS SHE DEL ON Monday 02-25-2022 ?BY DR LEGER  ?PT NOTICED THE INCISION OPENED AT 1119PM- ?WENT HOME ON Friday  ?SHE DOESN'T THINK ITS DRAINING ?AREA  HURTS  ?AREA - IS SIZE OF HEAD OF Q-TIP ? ?

## 2022-03-30 ENCOUNTER — Emergency Department (HOSPITAL_BASED_OUTPATIENT_CLINIC_OR_DEPARTMENT_OTHER)
Admission: EM | Admit: 2022-03-30 | Discharge: 2022-03-30 | Disposition: A | Discharge status: Home / Self Care | Payer: BC Managed Care – PPO | Attending: Emergency Medicine | Admitting: Emergency Medicine

## 2022-03-30 ENCOUNTER — Encounter (HOSPITAL_BASED_OUTPATIENT_CLINIC_OR_DEPARTMENT_OTHER): Payer: Self-pay | Admitting: Emergency Medicine

## 2022-03-30 DIAGNOSIS — N644 Mastodynia: Secondary | ICD-10-CM

## 2022-03-30 DIAGNOSIS — X500XXA Overexertion from strenuous movement or load, initial encounter: Secondary | ICD-10-CM | POA: Insufficient documentation

## 2022-03-30 DIAGNOSIS — N61 Mastitis without abscess: Secondary | ICD-10-CM | POA: Diagnosis not present

## 2022-03-30 DIAGNOSIS — R609 Edema, unspecified: Secondary | ICD-10-CM | POA: Diagnosis not present

## 2022-03-30 MED ORDER — ACETAMINOPHEN 500 MG PO TABS
1000.0000 mg | ORAL_TABLET | Freq: Once | ORAL | Status: AC
  Administered 2022-03-30: 1000 mg via ORAL
  Filled 2022-03-30: qty 2

## 2022-03-30 MED ORDER — LIDOCAINE 5 % EX PTCH
1.0000 | MEDICATED_PATCH | CUTANEOUS | Status: DC
  Administered 2022-03-30: 1 via TRANSDERMAL
  Filled 2022-03-30: qty 1

## 2022-03-30 MED ORDER — DICLOXACILLIN SODIUM 500 MG PO CAPS: 500.0000 mg | ORAL_CAPSULE | Freq: Four times a day (QID) | ORAL | 0 refills | Status: DC

## 2022-03-30 MED ORDER — LIDOCAINE 5 % EX PTCH: 1.0000 | MEDICATED_PATCH | CUTANEOUS | 0 refills | Status: DC

## 2022-03-30 MED ORDER — ONDANSETRON 4 MG PO TBDP
8.0000 mg | ORAL_TABLET | Freq: Once | ORAL | Status: AC
  Administered 2022-03-30: 8 mg via ORAL
  Filled 2022-03-30: qty 2

## 2022-03-30 NOTE — ED Provider Notes (Signed)
?[facility] EM[facility]PT ?Provider Note ? ? ?CSN: [id number] ?Arrival date & time: 03/30/22  0153 ? ?  ? ?History ? ?No chief complaint on file. ? ? ?Jordan Williams is a 26 y.o. female. ? ?The history is provided by the patient.  ?Illness ?Location:  Lateral to the right breast and inferior to the right breast ?Quality:  Pain ?Severity:  Moderate ?Onset quality:  Gradual ?Duration:  6 hours ?Timing:  Constant ?Progression:  Unchanged ?Chronicity:  New ?Context:  Was lifting heavy baskets but is concerned about mastitis as she has had this before.  No drainage, no fevers. ?Relieved by:  Nothing ?Worsened by:  Nothing ?Ineffective treatments:  Tried ibuprofen but vomited ?Associated symptoms: no abdominal pain, no cough, no fever, no myalgias, no shortness of breath and no wheezing   ? ?  ? ?Home Medications ?Prior to Admission medications   ?Medication Sig Start Date End Date Taking? Authorizing Provider  ?dicloxacillin (DYNAPEN) 500 MG capsule Take 1 capsule (500 mg total) by mouth 4 (four) times daily. 03/05/22   Calvert Cantor, CNM  ?ketorolac (TORADOL) 10 MG tablet Take 1 tablet (10 mg total) by mouth every 6 (six) hours as needed. 03/05/22 04/04/22  Calvert Cantor, CNM  ?lamoTRIgine (LAMICTAL) 25 MG tablet Take 150 mg by mouth at bedtime. 04/28/20   [provider]  ?metFORMIN (GLUCOPHAGE) 1000 MG tablet Take 1 tablet (1,000 mg total) by mouth 2 (two) times daily with a meal. 02/02/22   Rasch, Harolyn Rutherford, NP  ?Prenatal Vit-Fe Fumarate-FA (PRENATAL MULTIVITAMIN) TABS tablet Take 1 tablet by mouth daily at 12 noon.    [provider]  ?   ? ?Allergies    ?Patient has no known allergies.   ? ?Review of Systems   ?Review of Systems  ?Constitutional:  Negative for fever.  ?HENT:  Negative for facial swelling.   ?Eyes:  Negative for redness.  ?Respiratory:  Negative for cough, shortness of breath and wheezing.   ?Gastrointestinal:  Negative for abdominal pain.  ?Genitourinary:   Negative for difficulty urinating.  ?Musculoskeletal:  Negative for myalgias.  ?Psychiatric/Behavioral:  Negative for agitation.   ?All other systems reviewed and are negative. ? ?Physical Exam ?Updated Vital Signs ?BP 117/80   Pulse 85   Temp 98.2 ?F (36.8 ?C) (Oral)   Resp 18   Wt 102.1 kg   LMP 05/29/2021 (Exact Date)   SpO2 100%   Breastfeeding Yes   BMI 38.62 kg/m?  ?Physical Exam ?Vitals and nursing note reviewed. Exam conducted with a chaperone present.  ?Constitutional:   ?   General: She is not in acute distress. ?   Appearance: Normal appearance.  ?HENT:  ?   Head: Normocephalic and atraumatic.  ?   Nose: Nose normal.  ?Eyes:  ?   Conjunctiva/sclera: Conjunctivae normal.  ?   Pupils: Pupils are equal, round, and reactive to light.  ?Cardiovascular:  ?   Rate and Rhythm: Normal rate and regular rhythm.  ?   Pulses: Normal pulses.  ?   Heart sounds: Normal heart sounds.  ?Pulmonary:  ?   Effort: Pulmonary effort is normal.  ?   Breath sounds: Normal breath sounds.  ?Chest:  ? ? ?   Comments: No assymmetry of the breast, no retraction no discharge.  No lumps palpated  ?Abdominal:  ?   General: Bowel sounds are normal.  ?   Palpations: Abdomen is soft.  ?   Tenderness: There is no abdominal tenderness.  ?Musculoskeletal:     ?  General: Normal range of motion.  ?   Cervical back: Normal range of motion and neck supple.  ?Skin: ?   General: Skin is warm and dry.  ?   Capillary Refill: Capillary refill takes less than 2 seconds.  ?Neurological:  ?   General: No focal deficit present.  ?   Mental Status: She is alert and oriented to person, place, and time.  ?   Deep Tendon Reflexes: Reflexes normal.  ?Psychiatric:     ?   Mood and Affect: Mood normal.     ?   Behavior: Behavior normal.  ? ? ?ED Results / Procedures / Treatments   ?Labs ?(all labs ordered are listed, but only abnormal results are displayed) ?Labs Reviewed - No data to display ? ?EKG ?None ? ?Radiology ?No results  found. ? ?Procedures ?Procedures  ? ? ?Medications Ordered in ED ?Medications  ?ondansetron (ZOFRAN-ODT) disintegrating tablet 8 mg (has no administration in time range)  ?acetaminophen (TYLENOL) tablet 1,000 mg (has no administration in time range)  ?lidocaine (LIDODERM) 5 % 1 patch (has no administration in time range)  ? ? ?ED Course/ Medical Decision Making/ A&P ?  ?                        ?Medical Decision Making ?Chest wall pain since lifting baskets but is concerned about mastitis  ? ?Amount and/or Complexity of Data Reviewed ?External Data Reviewed: notes. ?   Details: previous notes reviewed ? ?Risk ?OTC drugs. ?Prescription drug management. ?Risk Details: I believe this is likely a pulled muscle but I have prescribed dicloxacillin until patient can see her GYN.  I have spoken to pharmacy and lidoderm is safe with breast feeding as this is a concern for the patient.  Call you GYN for close follow up.   ? ? ? ?Final Clinical Impression(s) / ED Diagnoses ?Final diagnoses:  ?None  ? ?Return for intractable cough, coughing up blood, fevers > 100.4 unrelieved by medication, shortness of breath, intractable vomiting, chest pain, shortness of breath, weakness, numbness, changes in speech, facial asymmetry, abdominal pain, passing out, Inability to tolerate liquids or food, cough, altered mental status or any concerns. No signs of systemic illness or infection. The patient is nontoxic-appearing on exam and vital signs are within normal limits.  ?I have reviewed the triage vital signs and the nursing notes. Pertinent labs & imaging results that were available during my care of the patient were reviewed by me and considered in my medical decision making (see chart for details). After history, exam, and medical workup I feel the patient has been appropriately medically screened and is safe for discharge home. Pertinent diagnoses were discussed with the patient. Patient was given return precautions.  ?Rx / DC  Orders ?ED Discharge Orders   ? ? None  ? ?  ? ? ?  ?Luman Holway, MD ?03/30/22 0246 ? ?

## 2022-03-30 NOTE — ED Triage Notes (Signed)
Presents from [facility] for R breast tenderness starting tonight. Recent h/o mastitis for which she took abx.  ?Emesis x 1 just pta.  ?Normal milk production  ?

## 2022-04-01 ENCOUNTER — Telehealth: Payer: Self-pay

## 2022-04-01 NOTE — Telephone Encounter (Signed)
Transition Care Management Follow-up Telephone Call ?Date of discharge and from where: 03/30/2022-[facility]  ?How have you been since you were released from the hospital? Pt stated she is feeling better.  ?Any questions or concerns? No ? ?Items Reviewed: ?Did the pt receive and understand the discharge instructions provided? Yes  ?Medications obtained and verified? Yes  ?Other? No  ?Any new allergies since your discharge? No  ?Dietary orders reviewed? No ?Do you have support at home? Yes  ? ?Home Care and Equipment/Supplies: ?Were home health services ordered? not applicable ?If so, what is the name of the agency? N/A  ?Has the agency set up a time to come to the patient's home? not applicable ?Were any new equipment or medical supplies ordered?  No ?What is the name of the medical supply agency? N/A ?Were you able to get the supplies/equipment? not applicable ?Do you have any questions related to the use of the equipment or supplies? No ? ?Functional Questionnaire: (I = Independent and D = Dependent) ?ADLs: I ? ?Bathing/Dressing- I ? ?Meal Prep- I ? ?Eating- I ? ?Maintaining continence- I ? ?Transferring/Ambulation- I ? ?Managing Meds- I ? ?Follow up appointments reviewed: ? ?PCP Hospital f/u appt confirmed? No   ?Specialist Hospital f/u appt confirmed? No   ?Are transportation arrangements needed? No  ?If their condition worsens, is the pt aware to call PCP or go to the Emergency Dept.? Yes ?Was the patient provided with contact information for the PCP's office or ED? Yes ?Was to pt encouraged to call back with questions or concerns? Yes  ?

## 2023-02-02 ENCOUNTER — Encounter (HOSPITAL_COMMUNITY): Payer: Self-pay | Admitting: Emergency Medicine

## 2023-02-02 ENCOUNTER — Emergency Department (HOSPITAL_COMMUNITY): Payer: BC Managed Care – PPO

## 2023-02-02 ENCOUNTER — Other Ambulatory Visit: Payer: Self-pay

## 2023-02-02 ENCOUNTER — Emergency Department (HOSPITAL_COMMUNITY)
Admission: EM | Admit: 2023-02-02 | Discharge: 2023-02-03 | Disposition: A | Discharge status: Home / Self Care | Payer: BC Managed Care – PPO | Attending: Emergency Medicine | Admitting: Emergency Medicine

## 2023-02-02 DIAGNOSIS — R0602 Shortness of breath: Secondary | ICD-10-CM | POA: Insufficient documentation

## 2023-02-02 DIAGNOSIS — M62838 Other muscle spasm: Secondary | ICD-10-CM

## 2023-02-02 DIAGNOSIS — M62830 Muscle spasm of back: Secondary | ICD-10-CM | POA: Insufficient documentation

## 2023-02-02 MED ORDER — ALBUTEROL SULFATE HFA 108 (90 BASE) MCG/ACT IN AERS: 2.0000 | INHALATION_SPRAY | RESPIRATORY_TRACT | Status: DC | PRN

## 2023-02-02 NOTE — ED Triage Notes (Signed)
PT reports sudden onset of right sided rib pain and sudden SOB.  While pt's vitals are WNL she is experiencing a increased work of breathing.  She reports a 4 hour car trip.

## 2023-02-03 ENCOUNTER — Emergency Department (HOSPITAL_COMMUNITY): Payer: BC Managed Care – PPO

## 2023-02-03 DIAGNOSIS — R0602 Shortness of breath: Secondary | ICD-10-CM | POA: Diagnosis not present

## 2023-02-03 LAB — CBC WITH DIFFERENTIAL/PLATELET
Abs Immature Granulocytes: 0.02 10*3/uL (ref 0.00–0.07)
Basophils Absolute: 0.1 10*3/uL (ref 0.0–0.1)
Basophils Relative: 1 %
Eosinophils Absolute: 0.1 10*3/uL (ref 0.0–0.5)
Eosinophils Relative: 1 %
HCT: 39 % (ref 36.0–46.0)
Hemoglobin: 13.6 g/dL (ref 12.0–15.0)
Immature Granulocytes: 0 %
Lymphocytes Relative: 22 %
Lymphs Abs: 1.7 10*3/uL (ref 0.7–4.0)
MCH: 31.3 pg (ref 26.0–34.0)
MCHC: 34.9 g/dL (ref 30.0–36.0)
MCV: 89.7 fL (ref 80.0–100.0)
Monocytes Absolute: 0.6 10*3/uL (ref 0.1–1.0)
Monocytes Relative: 7 %
Neutro Abs: 5.3 10*3/uL (ref 1.7–7.7)
Neutrophils Relative %: 69 %
Platelets: 275 10*3/uL (ref 150–400)
RBC: 4.35 MIL/uL (ref 3.87–5.11)
RDW: 12.2 % (ref 11.5–15.5)
WBC: 7.7 10*3/uL (ref 4.0–10.5)
nRBC: 0 % (ref 0.0–0.2)

## 2023-02-03 LAB — BASIC METABOLIC PANEL
Anion gap: 10 (ref 5–15)
BUN: 9 mg/dL (ref 6–20)
CO2: 22 mmol/L (ref 22–32)
Calcium: 8.7 mg/dL — ABNORMAL LOW (ref 8.9–10.3)
Chloride: 107 mmol/L (ref 98–111)
Creatinine, Ser: 0.67 mg/dL (ref 0.44–1.00)
GFR, Estimated: >60 mL/min (ref >60)
Glucose, Bld: 118 mg/dL — ABNORMAL HIGH (ref 70–99)
Potassium: 3.7 mmol/L (ref 3.5–5.1)
Sodium: 139 mmol/L (ref 135–145)

## 2023-02-03 LAB — D-DIMER, QUANTITATIVE: D-Dimer, Quant: 0.72 ug/mL-FEU — ABNORMAL HIGH (ref 0.00–0.50)

## 2023-02-03 LAB — I-STAT BETA HCG BLOOD, ED (MC, WL, AP ONLY): I-stat hCG, quantitative: <5 m[IU]/mL (ref <5)

## 2023-02-03 MED ORDER — IOHEXOL 350 MG/ML SOLN
75.0000 mL | Freq: Once | INTRAVENOUS | Status: AC | PRN
  Administered 2023-02-03: 75 mL via INTRAVENOUS

## 2023-02-03 MED ORDER — METHOCARBAMOL 500 MG PO TABS: 500.0000 mg | ORAL_TABLET | Freq: Three times a day (TID) | ORAL | 0 refills | Status: DC | PRN

## 2023-02-03 MED ORDER — IBUPROFEN 800 MG PO TABS: 800.0000 mg | ORAL_TABLET | Freq: Four times a day (QID) | ORAL | 0 refills | Status: DC | PRN

## 2023-02-04 NOTE — ED Provider Notes (Signed)
[facility] Provider Note   CSN: [id number] Arrival date & time: 02/02/23  2311     History  Chief Complaint  Patient presents with   Shortness of Breath    Jordan Williams is a 27 y.o. female.  Patient presents to the emergency department for evaluation of right-sided rib area pain.  Patient reports that the pain began somewhat suddenly.  Pain is sharp and stabbing, worsens when she moves.  There is also some pain with breathing.  She did recently have a long car trip.       Home Medications Prior to Admission medications   Medication Sig Start Date End Date Taking? Authorizing Provider  ibuprofen (ADVIL) 800 MG tablet Take 1 tablet (800 mg total) by mouth every 6 (six) hours as needed for moderate pain. 02/03/23  Yes Jafet Wissing, Gwenyth Allegra, MD  methocarbamol (ROBAXIN) 500 MG tablet Take 1 tablet (500 mg total) by mouth every 8 (eight) hours as needed for muscle spasms. 02/03/23  Yes Maanav Kassabian, Gwenyth Allegra, MD  dicloxacillin (DYNAPEN) 500 MG capsule Take 1 capsule (500 mg total) by mouth 4 (four) times daily. 03/30/22   Palumbo, April, MD  lamoTRIgine (LAMICTAL) 25 MG tablet Take 150 mg by mouth at bedtime. 04/28/20   [provider]  lidocaine (LIDODERM) 5 % Place 1 patch onto the skin daily. Remove & Discard patch within 12 hours or as directed by MD 03/30/22   Randal Buba, April, MD  metFORMIN (GLUCOPHAGE) 1000 MG tablet Take 1 tablet (1,000 mg total) by mouth 2 (two) times daily with a meal. 02/02/22   Rasch, Artist Pais, NP  Prenatal Vit-Fe Fumarate-FA (PRENATAL MULTIVITAMIN) TABS tablet Take 1 tablet by mouth daily at 12 noon.    [provider]      Allergies    Patient has no known allergies.    Review of Systems   Review of Systems  Physical Exam Updated Vital Signs BP 131/87   Pulse 70   Temp 97.8 F (36.6 C) (Oral)   Resp 18   Ht 5' 4"$  (1.626 m)   Wt 106.6 kg   SpO2 100%   BMI 40.34 kg/m  Physical  Exam Vitals and nursing note reviewed.  Constitutional:      General: She is not in acute distress.    Appearance: She is well-developed.  HENT:     Head: Normocephalic and atraumatic.     Mouth/Throat:     Mouth: Mucous membranes are moist.  Eyes:     General: Vision grossly intact. Gaze aligned appropriately.     Extraocular Movements: Extraocular movements intact.     Conjunctiva/sclera: Conjunctivae normal.  Cardiovascular:     Rate and Rhythm: Normal rate and regular rhythm.     Pulses: Normal pulses.     Heart sounds: Normal heart sounds, S1 normal and S2 normal. No murmur heard.    No friction rub. No gallop.  Pulmonary:     Effort: Pulmonary effort is normal. No respiratory distress.     Breath sounds: Normal breath sounds.  Chest:     Chest wall: Tenderness present.    Abdominal:     General: Bowel sounds are normal.     Palpations: Abdomen is soft.     Tenderness: There is no abdominal tenderness. There is no guarding or rebound.     Hernia: No hernia is present.  Musculoskeletal:        General: No swelling.     Cervical  back: Full passive range of motion without pain, normal range of motion and neck supple. No spinous process tenderness or muscular tenderness. Normal range of motion.     Right lower leg: No edema.     Left lower leg: No edema.  Skin:    General: Skin is warm and dry.     Capillary Refill: Capillary refill takes less than 2 seconds.     Findings: No ecchymosis, erythema, rash or wound.  Neurological:     General: No focal deficit present.     Mental Status: She is alert and oriented to person, place, and time.     GCS: GCS eye subscore is 4. GCS verbal subscore is 5. GCS motor subscore is 6.     Cranial Nerves: Cranial nerves 2-12 are intact.     Sensory: Sensation is intact.     Motor: Motor function is intact.     Coordination: Coordination is intact.  Psychiatric:        Attention and Perception: Attention normal.        Mood and Affect:  Mood normal.        Speech: Speech normal.        Behavior: Behavior normal.     ED Results / Procedures / Treatments   Labs (all labs ordered are listed, but only abnormal results are displayed) Labs Reviewed  D-DIMER, QUANTITATIVE (NOT AT [facility]) - Abnormal; Notable for the following components:      Result Value   D-Dimer, Quant 0.72 (*)    All other components within normal limits  BASIC METABOLIC PANEL - Abnormal; Notable for the following components:   Glucose, Bld 118 (*)    Calcium 8.7 (*)    All other components within normal limits  CBC WITH DIFFERENTIAL/PLATELET  I-STAT BETA HCG BLOOD, ED ([facility], [facility], [facility] ONLY)    EKG EKG Interpretation  Date/Time:  Monday February 03 2023 00:01:32 EST Ventricular Rate:  88 PR Interval:  174 QRS Duration: 94 QT Interval:  338 QTC Calculation: 409 R Axis:   13 Text Interpretation: Sinus rhythm RSR' in V1 or V2, right VCD or RVH Borderline T abnormalities, inferior leads ST elevation, consider lateral injury Confirmed by Orpah Greek [phone]) on 02/03/2023 5:12:45 AM  Radiology CT Angio Chest Pulmonary Embolism (PE) W or WO Contrast  Result Date: 02/03/2023 CLINICAL DATA:  27 year old female with shortness of breath. Abnormal D-dimer. EXAM: CT ANGIOGRAPHY CHEST WITH CONTRAST TECHNIQUE: Multidetector CT imaging of the chest was performed using the standard protocol during bolus administration of intravenous contrast. Multiplanar CT image reconstructions and MIPs were obtained to evaluate the vascular anatomy. RADIATION DOSE REDUCTION: This exam was performed according to the departmental dose-optimization program which includes automated exposure control, adjustment of the mA and/or kV according to patient size and/or use of iterative reconstruction technique. CONTRAST:  65m OMNIPAQUE IOHEXOL 350 MG/ML SOLN COMPARISON:  Chest radiographs yesterday. FINDINGS: Cardiovascular: Good contrast bolus timing in the pulmonary arterial tree. No  pulmonary artery filling defect identified. The central pulmonary arteries seems mildly enlarged on series 7, image 60, but other pulmonary arterial size seems within normal limits. No convincing cardiomegaly. No pericardial effusion. Negative visible aorta. No obvious calcified coronary artery plaque. Mediastinum/Nodes: Negative. Lungs/Pleura: Somewhat low lung volumes with mild respiratory motion, but the major airways are patent. Symmetric dependent atelectasis. Lungs otherwise appear negative. No pleural effusion. Upper Abdomen: Negative visible liver, gallbladder, spleen, pancreas, adrenal glands, kidneys, and bowel in the visible upper abdomen. Musculoskeletal: Mild  thoracic scoliosis. No acute osseous abnormality identified. Review of the MIP images confirms the above findings. IMPRESSION: 1. Negative for acute pulmonary embolus. 2. Negative chest CTA aside from mild pulmonary atelectasis. Electronically Signed   By: Genevie Ann M.D.   On: 02/03/2023 05:24   DG Chest 2 View  Result Date: 02/03/2023 CLINICAL DATA:  Shortness of breath EXAM: CHEST - 2 VIEW COMPARISON:  None Available. FINDINGS: Heart is borderline enlarged. Mediastinal silhouette is within normal limits. Both lungs are clear. The visualized skeletal structures are unremarkable. IMPRESSION: Borderline cardiomegaly. No active cardiopulmonary disease. Electronically Signed   By: Ronney Asters M.D.   On: 02/03/2023 00:00    Procedures Procedures    Medications Ordered in ED Medications  iohexol (OMNIPAQUE) 350 MG/ML injection 75 mL (75 mLs Intravenous Contrast Given 02/03/23 0441)    ED Course/ Medical Decision Making/ A&P                             Medical Decision Making Amount and/or Complexity of Data Reviewed Labs: ordered. Radiology: ordered.  Risk Prescription drug management.   Presents to the emergency department for evaluation of right-sided chest pain.  Patient with sudden onset sharp pleuritic pain with shortness  of breath.  She is not on exogenous estrogen but does report a recent car trip.  Examination was most consistent with musculoskeletal pain but PE was also considered.  Patient had a D-dimer that was mildly elevated and therefore underwent CT scan that ruled out for PE.  Treat as musculoskeletal pain.        Final Clinical Impression(s) / ED Diagnoses Final diagnoses:  Spasm of skeletal muscle of thorax    Rx / DC Orders ED Discharge Orders          Ordered    ibuprofen (ADVIL) 800 MG tablet  Every 6 hours PRN        02/03/23 0528    methocarbamol (ROBAXIN) 500 MG tablet  Every 8 hours PRN        02/03/23 0528              Orpah Greek, MD 02/04/23 [phone]

## 2023-02-09 ENCOUNTER — Ambulatory Visit (HOSPITAL_COMMUNITY)
Admission: EM | Admit: 2023-02-09 | Discharge: 2023-02-09 | Disposition: A | Discharge status: Home / Self Care | Payer: BC Managed Care – PPO

## 2023-02-09 ENCOUNTER — Encounter (HOSPITAL_COMMUNITY): Payer: Self-pay

## 2023-02-09 DIAGNOSIS — H60502 Unspecified acute noninfective otitis externa, left ear: Secondary | ICD-10-CM

## 2023-02-09 DIAGNOSIS — H6122 Impacted cerumen, left ear: Secondary | ICD-10-CM

## 2023-02-09 MED ORDER — CARBAMIDE PEROXIDE 6.5 % OT SOLN: OTIC | 0 refills | Status: DC

## 2023-02-09 MED ORDER — CIPROFLOXACIN-DEXAMETHASONE 0.3-0.1 % OT SUSP: 4.0000 [drp] | Freq: Two times a day (BID) | OTIC | 0 refills | Status: AC

## 2023-02-09 NOTE — ED Provider Notes (Signed)
[place]    CSN: [id number] Arrival date & time: 02/09/23  1440      History   Chief Complaint Chief Complaint  Patient presents with   Otalgia    HPI Jordan Williams is a 27 y.o. female.   Patient presents to clinic with left ear pain.  This has been ongoing for the past 6 days, initially started with ear fullness.  She has been trying to get out her wax with hydrogen peroxide, a method she saw on TikTok, and after she did not have any success with that and ear pain continued, she decided to try a Q-tip.  Upon Q-tip removal she noticed some blood on her Q-tip and decided to stop.  Denies recent illness, cough, shortness of breath, nasal congestion or other issues.    The history is provided by the patient.  Otalgia Associated symptoms: no cough and no rhinorrhea     Past Medical History:  Diagnosis Date   Anxiety    Asthma    when young, no meds currently   Bipolar 1 disorder ([place])    Depression    PCOS (polycystic ovarian syndrome)    Trichomoniasis    Type 2 diabetes mellitus ([place])     Patient Active Problem List   Diagnosis Date Noted   Pregnancy 02/24/2022   Encounter for screening for COVID-19 08/01/2020    Past Surgical History:  Procedure Laterality Date   CESAREAN SECTION N/A 02/25/2022   Procedure: CESAREAN SECTION;  Surgeon: Tyson Dense, MD;  Location: [facility] LD ORS;  Service: Obstetrics;  Laterality: N/A;   NO PAST SURGERIES      OB History     Gravida  2   Para  1   Term  1   Preterm  0   AB  1   Living  1      SAB  1   IAB  0   Ectopic  0   Multiple  0   Live Births  1            Home Medications    Prior to Admission medications   Medication Sig Start Date End Date Taking? Authorizing Provider  carbamide peroxide (DEBROX) 6.5 % OTIC solution Instill 5 to 10 drops of the 6.5% otic liquid into the affected ear(s) 2 times daily for up to 4 days if needed 02/09/23  Yes Louretta Shorten, [place] N, FNP   ciprofloxacin-dexamethasone (CIPRODEX) OTIC suspension Place 4 drops into the left ear 2 (two) times daily for 7 days. 02/09/23 02/16/23 Yes Louretta Shorten, [place] N, FNP  ibuprofen (ADVIL) 800 MG tablet Take 1 tablet (800 mg total) by mouth every 6 (six) hours as needed for moderate pain. 02/03/23  Yes Pollina, Gwenyth Allegra, MD  lamoTRIgine (LAMICTAL) 25 MG tablet Take 150 mg by mouth at bedtime. 04/28/20  Yes [provider]  metFORMIN (GLUCOPHAGE) 1000 MG tablet Take 1 tablet (1,000 mg total) by mouth 2 (two) times daily with a meal. 02/02/22  Yes Rasch, Anderson [place] I, NP  sertraline (ZOLOFT) 50 MG tablet Take 50 mg by mouth daily. 06/23/20  Yes [provider]  methocarbamol (ROBAXIN) 500 MG tablet Take 1 tablet (500 mg total) by mouth every 8 (eight) hours as needed for muscle spasms. 02/03/23   Orpah Greek, MD    Family History Family History  Problem Relation Age of Onset   Healthy Mother    Healthy Father     Social History Social History  Tobacco Use   Smoking status: Never   Smokeless tobacco: Never  Vaping Use   Vaping Use: Never used  Substance Use Topics   Alcohol use: Not Currently    Comment: occ   Drug use: Never     Allergies   Patient has no known allergies.   Review of Systems Review of Systems  Constitutional:  Negative for diaphoresis and fatigue.  HENT:  Positive for ear pain. Negative for rhinorrhea.   Eyes:  Negative for discharge.  Respiratory:  Negative for cough.   Cardiovascular:  Negative for chest pain.     Physical Exam Triage Vital Signs ED Triage Vitals  Enc Vitals Group     BP 02/09/23 1542 117/71     Pulse Rate 02/09/23 1542 (!) 117     Resp 02/09/23 1542 16     Temp 02/09/23 1542 98.5 F (36.9 C)     Temp Source 02/09/23 1542 Oral     SpO2 02/09/23 1542 96 %     Weight 02/09/23 1541 235 lb (106.6 kg)     Height 02/09/23 1541 5' 4"$  (1.626 m)     Head Circumference --      Peak Flow --      Pain Score  02/09/23 1538 6     Pain Loc --      Pain Edu? --      Excl. in [place]? --    No data found.  Updated Vital Signs BP 117/71 (BP Location: Left Arm)   Pulse (!) 117   Temp 98.5 F (36.9 C) (Oral)   Resp 16   Ht 5' 4"$  (1.626 m)   Wt 235 lb (106.6 kg)   LMP 01/30/2023 (Exact Date)   SpO2 96%   Breastfeeding No   BMI 40.34 kg/m   Visual Acuity Right Eye Distance:   Left Eye Distance:   Bilateral Distance:    Right Eye Near:   Left Eye Near:    Bilateral Near:     Physical Exam Vitals and nursing note reviewed.  Constitutional:      General: She is not in acute distress.    Appearance: Normal appearance. She is well-developed and normal weight. She is not ill-appearing, toxic-appearing or diaphoretic.     Comments: Pleasant 27 year old female who appears stated age.  HENT:     Head: Normocephalic and atraumatic.     Right Ear: External ear normal. Decreased hearing noted. Drainage and swelling present. No tenderness. No middle ear effusion. There is impacted cerumen.     Left Ear: Tympanic membrane, ear canal and external ear normal. No drainage or tenderness.  No middle ear effusion. There is no impacted cerumen. Tympanic membrane is not erythematous or bulging.     Ears:     Comments: Left external ear canal with purulent drainage, erythema and tenderness, left-sided cerumen impaction, unable to visualize tympanic membranes.  Reports muffled hearing to the left side.    Nose: No congestion or rhinorrhea.     Mouth/Throat:     Mouth: Mucous membranes are moist. No oral lesions.     Pharynx: No pharyngeal swelling, oropharyngeal exudate, posterior oropharyngeal erythema or uvula swelling.     Tonsils: No tonsillar exudate or tonsillar abscesses.  Eyes:     General:        Right eye: No discharge.        Left eye: No discharge.     Extraocular Movements: Extraocular movements intact.     Right eye:  Normal extraocular motion.     Left eye: Normal extraocular motion.      Conjunctiva/sclera: Conjunctivae normal.  Cardiovascular:     Rate and Rhythm: Normal rate and regular rhythm.  Pulmonary:     Effort: Pulmonary effort is normal.  Musculoskeletal:     Cervical back: Normal range of motion and neck supple.  Lymphadenopathy:     Cervical: No cervical adenopathy.  Skin:    General: Skin is warm and dry.  Neurological:     General: No focal deficit present.     Mental Status: She is alert and oriented to person, place, and time.  Psychiatric:        Mood and Affect: Mood normal.        Behavior: Behavior normal. Behavior is cooperative.      UC Treatments / Results  Labs (all labs ordered are listed, but only abnormal results are displayed) Labs Reviewed - No data to display  EKG   Radiology No results found.  Procedures Procedures (including critical care time)  Medications Ordered in UC Medications - No data to display  Initial Impression / Assessment and Plan / UC Course  I have reviewed the triage vital signs and the nursing notes.  Pertinent labs & imaging results that were available during my care of the patient were reviewed by me and considered in my medical decision making (see chart for details).  Left-sided acute otitis externa, EAC with erythema, purulent drainage and unable to visualize TM d/t ceremun impaction which was irrigated in clinic.  Irrigation with moderate cerumen removal, tympanic membrane intact.  Will cover with Ciprodex.  Discussed methods to help with cerumen in future, after infection has resolved.  Discussed plan of care with patient, follow-up reviewed, patient verbalized understanding.     Final Clinical Impressions(s) / UC Diagnoses   Final diagnoses:  Impacted cerumen of left ear  Acute otitis externa of left ear, unspecified type     Discharge Instructions      You have an external ear infection of your left ear, this is likely due to you have a earwax impaction.  We have cleared that wax with  irrigation in clinic.  Please use Ciprodex drops twice daily over the next 7 days into your left ear.  You can alternate between Tylenol and ibuprofen as needed for pain.  After this infection has cleared, if you continue to have issues with earwax buildup you can use the Debrox solution as prescribed if needed.  Please follow-up with your primary care if you continue to have issues, or return to clinic.     ED Prescriptions     Medication Sig Dispense Auth. Provider   ciprofloxacin-dexamethasone (CIPRODEX) OTIC suspension Place 4 drops into the left ear 2 (two) times daily for 7 days. 7.5 mL Louretta Shorten, [place] N, FNP   carbamide peroxide (DEBROX) 6.5 % OTIC solution Instill 5 to 10 drops of the 6.5% otic liquid into the affected ear(s) 2 times daily for up to 4 days if needed 15 mL Kaya Pottenger, [place] N, [place]      I have reviewed the PDMP during this encounter.   Montavis Schubring, [place] N, [place] 02/09/23 [id number]

## 2023-02-09 NOTE — Discharge Instructions (Addendum)
You have an external ear infection of your left ear, this is likely due to you have a earwax impaction.  We have cleared that wax with irrigation in clinic.  Please use Ciprodex drops twice daily over the next 7 days into your left ear.  You can alternate between Tylenol and ibuprofen as needed for pain.  After this infection has cleared, if you continue to have issues with earwax buildup you can use the Debrox solution as prescribed if needed.  Please follow-up with your primary care if you continue to have issues, or return to clinic.

## 2023-02-09 NOTE — ED Triage Notes (Signed)
Patient presents with ear pain. States she thinks the eardrum has burst. Onset 6 days. Lots of pressure and some bleeding. States only the left ear is affected and she now can not hear out of the ear. Has been using OTC ear drops

## 2023-05-16 ENCOUNTER — Emergency Department (HOSPITAL_COMMUNITY)
Admission: EM | Admit: 2023-05-16 | Discharge: 2023-05-16 | Disposition: A | Discharge status: Home / Self Care | Payer: BC Managed Care – PPO | Attending: Emergency Medicine | Admitting: Emergency Medicine

## 2023-05-16 ENCOUNTER — Encounter (HOSPITAL_COMMUNITY): Payer: Self-pay

## 2023-05-16 DIAGNOSIS — R197 Diarrhea, unspecified: Secondary | ICD-10-CM | POA: Diagnosis not present

## 2023-05-16 DIAGNOSIS — E876 Hypokalemia: Secondary | ICD-10-CM | POA: Diagnosis not present

## 2023-05-16 DIAGNOSIS — Z7984 Long term (current) use of oral hypoglycemic drugs: Secondary | ICD-10-CM | POA: Insufficient documentation

## 2023-05-16 DIAGNOSIS — R112 Nausea with vomiting, unspecified: Secondary | ICD-10-CM | POA: Insufficient documentation

## 2023-05-16 LAB — TYPE AND SCREEN
ABO/RH(D): O POS
Antibody Screen: NEGATIVE

## 2023-05-16 LAB — HCG, QUANTITATIVE, PREGNANCY: hCG, Beta Chain, Quant, S: <1 m[IU]/mL (ref <5)

## 2023-05-16 LAB — CBC
HCT: 42.7 % (ref 36.0–46.0)
Hemoglobin: 14.5 g/dL (ref 12.0–15.0)
MCH: 29.5 pg (ref 26.0–34.0)
MCHC: 34 g/dL (ref 30.0–36.0)
MCV: 87 fL (ref 80.0–100.0)
Platelets: 321 10*3/uL (ref 150–400)
RBC: 4.91 MIL/uL (ref 3.87–5.11)
RDW: 12.4 % (ref 11.5–15.5)
WBC: 6.7 10*3/uL (ref 4.0–10.5)
nRBC: 0 % (ref 0.0–0.2)

## 2023-05-16 LAB — COMPREHENSIVE METABOLIC PANEL
ALT: 17 U/L (ref 0–44)
AST: 12 U/L — ABNORMAL LOW (ref 15–41)
Albumin: 3.9 g/dL (ref 3.5–5.0)
Alkaline Phosphatase: 55 U/L (ref 38–126)
Anion gap: 9 (ref 5–15)
BUN: 6 mg/dL (ref 6–20)
CO2: 22 mmol/L (ref 22–32)
Calcium: 8.9 mg/dL (ref 8.9–10.3)
Chloride: 107 mmol/L (ref 98–111)
Creatinine, Ser: 0.76 mg/dL (ref 0.44–1.00)
GFR, Estimated: >60 mL/min (ref >60)
Glucose, Bld: 101 mg/dL — ABNORMAL HIGH (ref 70–99)
Potassium: 3.2 mmol/L — ABNORMAL LOW (ref 3.5–5.1)
Sodium: 138 mmol/L (ref 135–145)
Total Bilirubin: 1 mg/dL (ref 0.3–1.2)
Total Protein: 7 g/dL (ref 6.5–8.1)

## 2023-05-16 LAB — LACTIC ACID, PLASMA: Lactic Acid, Venous: 0.4 mmol/L — ABNORMAL LOW (ref 0.5–1.9)

## 2023-05-16 LAB — LIPASE, BLOOD: Lipase: 34 U/L (ref 11–51)

## 2023-05-16 MED ORDER — LOPERAMIDE HCL 2 MG PO CAPS: 4.0000 mg | ORAL_CAPSULE | Freq: Two times a day (BID) | ORAL | 0 refills | Status: DC | PRN

## 2023-05-16 MED ORDER — ONDANSETRON 8 MG PO TBDP: 8.0000 mg | ORAL_TABLET | Freq: Three times a day (TID) | ORAL | 0 refills | Status: DC | PRN

## 2023-05-16 MED ORDER — ONDANSETRON 4 MG PO TBDP
4.0000 mg | ORAL_TABLET | Freq: Once | ORAL | Status: AC
  Administered 2023-05-16: 4 mg via ORAL
  Filled 2023-05-16: qty 1

## 2023-05-16 MED ORDER — POTASSIUM CHLORIDE CRYS ER 20 MEQ PO TBCR
40.0000 meq | EXTENDED_RELEASE_TABLET | Freq: Once | ORAL | Status: AC
  Administered 2023-05-16: 40 meq via ORAL
  Filled 2023-05-16: qty 2

## 2023-05-16 NOTE — ED Provider Notes (Signed)
[place] EMERGENCY DEPARTMENT AT [facility] Provider Note   CSN: [id number] Arrival date & time: 05/16/23  1541     History  Chief Complaint  Patient presents with   Diarrhea    Jordan Williams is a 27 y.o. female.  HPI    27 year old female comes in with chief complaint of diarrhea and vomiting.  Patient states that she started having diarrhea about 5 days ago.  She is having 4-5 episodes of loose stools.  2 or 3 days ago she started having vomiting.  Patient has also noted some reddish tinge to her stool.  That change in her stool was today, prompting her to come to the emergency room.  Her PCP has referred her to GI clinic, but that appointment will be for several months.  Patient also states that she is on weight loss medications for the last 5 weeks.  She denies any recent travel history.  Home Medications Prior to Admission medications   Medication Sig Start Date End Date Taking? Authorizing Provider  loperamide (IMODIUM) 2 MG capsule Take 2 capsules (4 mg total) by mouth 2 (two) times daily as needed for diarrhea or loose stools. 05/16/23  Yes Arzella Rehmann, MD  ondansetron (ZOFRAN-ODT) 8 MG disintegrating tablet Take 1 tablet (8 mg total) by mouth every 8 (eight) hours as needed for nausea. 05/16/23  Yes Derwood Kaplan, MD  carbamide peroxide (DEBROX) 6.5 % OTIC solution Instill 5 to 10 drops of the 6.5% otic liquid into the affected ear(s) 2 times daily for up to 4 days if needed 02/09/23   Garrison, [place] N, FNP  ibuprofen (ADVIL) 800 MG tablet Take 1 tablet (800 mg total) by mouth every 6 (six) hours as needed for moderate pain. 02/03/23   Gilda Crease, MD  lamoTRIgine (LAMICTAL) 25 MG tablet Take 150 mg by mouth at bedtime. 04/28/20   [provider]  metFORMIN (GLUCOPHAGE) 1000 MG tablet Take 1 tablet (1,000 mg total) by mouth 2 (two) times daily with a meal. 02/02/22   Rasch, Harolyn Rutherford, NP  methocarbamol (ROBAXIN) 500 MG tablet Take 1 tablet  (500 mg total) by mouth every 8 (eight) hours as needed for muscle spasms. 02/03/23   Gilda Crease, MD  sertraline (ZOLOFT) 50 MG tablet Take 50 mg by mouth daily. 06/23/20   [provider]      Allergies    Patient has no known allergies.    Review of Systems   Review of Systems  All other systems reviewed and are negative.   Physical Exam Updated Vital Signs BP 108/73   Pulse (!) 102   Temp 98.9 F (37.2 C)   Resp 18   Ht 5\' 4"  (1.626 m)   Wt 105.7 kg   SpO2 100%   BMI 39.99 kg/m  Physical Exam Vitals and nursing note reviewed.  Constitutional:      Appearance: She is well-developed.  HENT:     Head: Normocephalic and atraumatic.  Eyes:     Extraocular Movements: Extraocular movements intact.  Cardiovascular:     Rate and Rhythm: Normal rate.  Pulmonary:     Effort: Pulmonary effort is normal.  Musculoskeletal:     Cervical back: Normal range of motion and neck supple.  Skin:    General: Skin is dry.  Neurological:     Mental Status: She is alert and oriented to person, place, and time.     ED Results / Procedures / Treatments   Labs (all  labs ordered are listed, but only abnormal results are displayed) Labs Reviewed  COMPREHENSIVE METABOLIC PANEL - Abnormal; Notable for the following components:      Result Value   Potassium 3.2 (*)    Glucose, Bld 101 (*)    AST 12 (*)    All other components within normal limits  CBC  HCG, QUANTITATIVE, PREGNANCY  LIPASE, BLOOD  LACTIC ACID, PLASMA  LACTIC ACID, PLASMA  POC OCCULT BLOOD, ED  TYPE AND SCREEN    EKG None  Radiology No results found.  Procedures Procedures    Medications Ordered in ED Medications  potassium chloride SA (KLOR-CON M) CR tablet 40 mEq (40 mEq Oral Given 05/16/23 1839)  ondansetron (ZOFRAN-ODT) disintegrating tablet 4 mg (4 mg Oral Given 05/16/23 1840)    ED Course/ Medical Decision Making/ A&P                             Medical Decision Making Amount  and/or Complexity of Data Reviewed Labs: ordered.  Risk Prescription drug management.   27 year old female comes in with chief complaint of nausea, vomiting, diarrhea.  She also has noted that her stool was red recently.  Additionally she is on weight loss medications that is coming through compound pharmacy.  Her prescriber is telehealth individual.  She saw her PCP, she has a GI follow-up at that 1 before a month.  Differential diagnosis considered for this patient includes gastroenteritis, food poisoning, severe electrolyte abnormality, colitis, side effects of weight loss medication.  Patient is otherwise healthy, she does not appear to be in any distress.  Her symptoms have been going on for 5 days, they are worsening, less likely for this to be infectious.  My suspicion is high that most likely she is having side effects of the weight loss medications.  For now, the plan is to get basic labs to ensure there is no concerning findings.  If the lab workup is reassuring, then we will discharge patient with symptom relief medications.  I have advised that she has another conversation with the weight loss clinician to discuss the side effects.  7:30 PM Patient CBC is normal.  No leukocytosis, no profound anemia.  Metabolic profile is also normal.  Lipase, lactic acid pending at this time, but she is stable for discharge.  Final Clinical Impression(s) / ED Diagnoses Final diagnoses:  Nausea vomiting and diarrhea  Hypokalemia    Rx / DC Orders ED Discharge Orders          Ordered    ondansetron (ZOFRAN-ODT) 8 MG disintegrating tablet  Every 8 hours PRN        05/16/23 1838    loperamide (IMODIUM) 2 MG capsule  2 times daily PRN        05/16/23 Verdie Shire, MD 05/16/23 1930

## 2023-05-16 NOTE — ED Triage Notes (Signed)
Pt states she has had diarrhea x 5 days, emesis x2 days. Pt states she has now noticed some bright red blood in her stool . Pt was seen at PCP office, referred to GI, but unable to get appt for months.

## 2023-05-16 NOTE — Discharge Instructions (Signed)
We saw in the emergency room for nausea, vomiting, diarrhea.  History is not clearly indicative of infectious process.  It is possible that the weight loss medication could be causing side effects.   As discussed, typically the infectious cause of diarrhea are self-limiting.  If your symptoms persist, then you should have a conversation with your primary care doctor to see if medication side effect is the cause for you to have the symptoms you are having.  Take the nausea medications as needed.  Ensure you are hydrating well. Take diarrhea medication only for nighttime symptoms.  Return to the emergency room if you start having fevers, severe nausea and vomiting, bloody stools, bloody vomit.

## 2023-10-16 ENCOUNTER — Inpatient Hospital Stay (HOSPITAL_COMMUNITY)
Admission: AD | Admit: 2023-10-16 | Discharge: 2023-10-16 | Disposition: A | Discharge status: Home / Self Care | Payer: BC Managed Care – PPO | Attending: Obstetrics and Gynecology | Admitting: Obstetrics and Gynecology

## 2023-10-16 DIAGNOSIS — N939 Abnormal uterine and vaginal bleeding, unspecified: Secondary | ICD-10-CM | POA: Insufficient documentation

## 2023-10-16 DIAGNOSIS — Z3202 Encounter for pregnancy test, result negative: Secondary | ICD-10-CM | POA: Insufficient documentation

## 2023-10-16 DIAGNOSIS — E282 Polycystic ovarian syndrome: Secondary | ICD-10-CM | POA: Insufficient documentation

## 2023-10-16 DIAGNOSIS — E119 Type 2 diabetes mellitus without complications: Secondary | ICD-10-CM | POA: Insufficient documentation

## 2023-10-16 DIAGNOSIS — O209 Hemorrhage in early pregnancy, unspecified: Secondary | ICD-10-CM | POA: Diagnosis present

## 2023-10-16 DIAGNOSIS — R1110 Vomiting, unspecified: Secondary | ICD-10-CM | POA: Diagnosis present

## 2023-10-16 DIAGNOSIS — N390 Urinary tract infection, site not specified: Secondary | ICD-10-CM | POA: Diagnosis present

## 2023-10-16 LAB — CBC WITH DIFFERENTIAL/PLATELET
Abs Immature Granulocytes: 0.03 10*3/uL (ref 0.00–0.07)
Basophils Absolute: 0.1 10*3/uL (ref 0.0–0.1)
Basophils Relative: 1 %
Eosinophils Absolute: 0.4 10*3/uL (ref 0.0–0.5)
Eosinophils Relative: 4 %
HCT: 38.3 % (ref 36.0–46.0)
Hemoglobin: 12.8 g/dL (ref 12.0–15.0)
Immature Granulocytes: 0 %
Lymphocytes Relative: 48 %
Lymphs Abs: 4.3 10*3/uL — ABNORMAL HIGH (ref 0.7–4.0)
MCH: 29.6 pg (ref 26.0–34.0)
MCHC: 33.4 g/dL (ref 30.0–36.0)
MCV: 88.5 fL (ref 80.0–100.0)
Monocytes Absolute: 0.5 10*3/uL (ref 0.1–1.0)
Monocytes Relative: 5 %
Neutro Abs: 3.7 10*3/uL (ref 1.7–7.7)
Neutrophils Relative %: 42 %
Platelets: 287 10*3/uL (ref 150–400)
RBC: 4.33 MIL/uL (ref 3.87–5.11)
RDW: 12.3 % (ref 11.5–15.5)
WBC: 8.9 10*3/uL (ref 4.0–10.5)
nRBC: 0 % (ref 0.0–0.2)

## 2023-10-16 LAB — POCT PREGNANCY, URINE: Preg Test, Ur: NEGATIVE

## 2023-10-16 MED ORDER — TRANEXAMIC ACID 650 MG PO TABS: 1300.0000 mg | ORAL_TABLET | Freq: Three times a day (TID) | ORAL | 2 refills | Status: DC | PRN

## 2023-10-16 NOTE — MAU Provider Note (Signed)
Chief Complaint:  Back Pain and Vaginal Bleeding   Event Date/Time   First Provider Initiated Contact with Patient 10/16/23 2158       HPI: Jordan Williams is a 27 y.o. G2P1011 who presents to maternity admissions reporting onset of menses 5 days early  Bleeding has been heavy and caused episodes of nausea.  After discussion, she is primarily concerned about timing of cycle.  Has been trying to conceive since November.  Has tracked her cycles from 28-30 days.  States has PCOS which improved with Metformin and blood sugar control  A1C is under 6.  Has not seen GYN in a while  Primary doctor follows other issues.  Sees them tomorrow. She reports vaginal bleeding, denies urinary symptoms, dizziness, or fever/chills.    Vaginal Bleeding The patient's primary symptoms include pelvic pain and vaginal bleeding. This is a recurrent problem. The problem has been waxing and waning. She is not pregnant. Pertinent negatives include no chills, diarrhea, fever or frequency. The vaginal discharge was bloody. The vaginal bleeding is heavier than menses. She has been passing clots. She has not been passing tissue. She has tried nothing for the symptoms.   RN Note: Jordan Williams is a 28 y.o. at Unknown here in MAU reporting: abn bleeding. Sept, went to the dr, was told yeast infection.2 wks later after meds, didn't feel so great down there, went back to the dr, had a UTI (small clot in urine). Finished treatment.  Midway through Sept/Oct cycle, started passing blood clots. 2 days ago was spotting.  Made appt for tomorrow .  Started throwing up in classroom.  Started heavy bleeding, went through a tampon in 2 hrs.  This is 5 days early  for cycle. Pain in lower back, every time it comes- she feels nauseous.   LMP: 9/29 Onset of complaint: ongoing issues with bleeding- started again today. Pain score: lower back, 4  Past Medical History: Past Medical History:  Diagnosis Date   Anxiety    Asthma    when young, no  meds currently   Bipolar 1 disorder ([facility])    Depression    PCOS (polycystic ovarian syndrome)    Trichomoniasis    Type 2 diabetes mellitus ([facility])     Past obstetric history: OB History  Gravida Para Term Preterm AB Living  2 1 1  0 1 1  SAB IAB Ectopic Multiple Live Births  1 0 0 0 1    # Outcome Date GA Lbr Len/2nd Weight Sex Type Anes PTL Lv  2 Term 02/25/22 [redacted]w[redacted]d  3220 g M CS-LTranv EPI  LIV     Birth Comments: WNL  1 SAB 2021            Past Surgical History: Past Surgical History:  Procedure Laterality Date   CESAREAN SECTION N/A 02/25/2022   Procedure: CESAREAN SECTION;  Surgeon: Ranae Pila, MD;  Location: [facility] LD ORS;  Service: Obstetrics;  Laterality: N/A;   NO PAST SURGERIES      Family History: Family History  Problem Relation Age of Onset   Healthy Mother    Healthy Father     Social History: Social History   Tobacco Use   Smoking status: Never   Smokeless tobacco: Never  Vaping Use   Vaping status: Never Used  Substance Use Topics   Alcohol use: Not Currently    Comment: occ   Drug use: Never    Allergies: No Known Allergies  Meds:  Medications Prior to Admission  Medication Sig Dispense Refill Last Dose   carbamide peroxide (DEBROX) 6.5 % OTIC solution Instill 5 to 10 drops of the 6.5% otic liquid into the affected ear(s) 2 times daily for up to 4 days if needed 15 mL 0    ibuprofen (ADVIL) 800 MG tablet Take 1 tablet (800 mg total) by mouth every 6 (six) hours as needed for moderate pain. 20 tablet 0    lamoTRIgine (LAMICTAL) 25 MG tablet Take 150 mg by mouth at bedtime.      loperamide (IMODIUM) 2 MG capsule Take 2 capsules (4 mg total) by mouth 2 (two) times daily as needed for diarrhea or loose stools. 12 capsule 0    metFORMIN (GLUCOPHAGE) 1000 MG tablet Take 1 tablet (1,000 mg total) by mouth 2 (two) times daily with a meal. 90 tablet 1    methocarbamol (ROBAXIN) 500 MG tablet Take 1 tablet (500 mg total) by mouth every 8 (eight)  hours as needed for muscle spasms. 20 tablet 0    ondansetron (ZOFRAN-ODT) 8 MG disintegrating tablet Take 1 tablet (8 mg total) by mouth every 8 (eight) hours as needed for nausea. 20 tablet 0    sertraline (ZOLOFT) 50 MG tablet Take 50 mg by mouth daily.       I have reviewed patient's Past Medical Hx, Surgical Hx, Family Hx, Social Hx, medications and allergies.  ROS:  Review of Systems  Constitutional:  Negative for chills and fever.  Gastrointestinal:  Negative for diarrhea.  Genitourinary:  Positive for pelvic pain and vaginal bleeding. Negative for frequency.   Other systems negative     Physical Exam  Patient Vitals for the past 24 hrs:  BP Temp Temp src Pulse Resp SpO2 Height Weight  10/16/23 1901 139/88 98.2 F (36.8 C) Oral 78 18 100 % 5\' 4"  (1.626 m) 105.6 kg   Constitutional: Well-developed, well-nourished female in no acute distress.  Cardiovascular: normal rate  Respiratory: normal effort, no distress.  GI: Abd soft, non-tender.  Nondistended.  No rebound, No guarding.  Bowel Sounds audible  MS: Extremities nontender, no edema, normal ROM Neurologic: Alert and oriented x 4.   Grossly nonfocal. GU: Neg CVAT. Skin:  Warm and Dry Psych:  Affect appropriate.  PELVIC EXAM: deferred   Labs: Results for orders placed or performed during the hospital encounter of 10/16/23 (from the past 24 hour(s))  Pregnancy, urine POC     Status: None   Collection Time: 10/16/23  7:15 PM  Result Value Ref Range   Preg Test, Ur NEGATIVE NEGATIVE  CBC with Differential/Platelet     Status: Abnormal   Collection Time: 10/16/23  9:44 PM  Result Value Ref Range   WBC 8.9 4.0 - 10.5 K/uL   RBC 4.33 3.87 - 5.11 MIL/uL   Hemoglobin 12.8 12.0 - 15.0 g/dL   HCT 71.0 62.6 - 94.8 %   MCV 88.5 80.0 - 100.0 fL   MCH 29.6 26.0 - 34.0 pg   MCHC 33.4 30.0 - 36.0 g/dL   RDW 54.6 27.0 - 35.0 %   Platelets 287 150 - 400 K/uL   nRBC 0.0 0.0 - 0.2 %   Neutrophils Relative % 42 %   Neutro Abs  3.7 1.7 - 7.7 K/uL   Lymphocytes Relative 48 %   Lymphs Abs 4.3 (H) 0.7 - 4.0 K/uL   Monocytes Relative 5 %   Monocytes Absolute 0.5 0.1 - 1.0 K/uL   Eosinophils Relative 4 %   Eosinophils Absolute 0.4 0.0 -  0.5 K/uL   Basophils Relative 1 %   Basophils Absolute 0.1 0.0 - 0.1 K/uL   Immature Granulocytes 0 %   Abs Immature Granulocytes 0.03 0.00 - 0.07 K/uL    --/--/O POS (05/24 1617)  Imaging:  No results found.  MAU Course/MDM: I have reviewed the triage vital signs and the nursing notes.   Pertinent labs & imaging results that were available during my care of the patient were reviewed by me and considered in my medical decision making (see chart for details).      I have reviewed her medical records including past results, notes and treatments.   I have ordered labs as follows: UPT, CBC  both normal  Imaging ordered: none Results reviewed.   Consult Dr Henderson Cloud  He recommends either Megace or TXA. He will follow in office.   Treatments in MAU included CBC  Discussed nature and treatment for AUB Discussed cycle monitoring and tips for conception Recommend book "Taking Charge of your Fertility" Offered treatment with OCPs, Megace or TXA with Ibuprofen  Does not want any hormones due to hx bipolar Advised to take ibuprofen first, if it does not help may take TXA.   Pt stable at time of discharge.  Assessment: Abnormal Uterine Bleeding Trying to conceive  Plan: Discharge home Recommend Track cycles and ovulation Rx sent for Lysteda for AUB Try ibuprofen first for AUB Call office to schedule with Dr Henderson Cloud  Encouraged to return here or to other Urgent Care/ED if she develops worsening of symptoms, increase in pain, fever, or other concerning symptoms.   Wynelle Bourgeois CNM, MSN Certified Nurse-Midwife 10/16/2023 9:58 PM

## 2023-10-16 NOTE — MAU Note (Addendum)
Jordan Williams is a 27 y.o. at Unknown here in MAU reporting: abn bleeding. Sept, went to the dr, was told yeast infection.2 wks later after meds, didn't feel so great down there, went back to the dr, had a UTI (small clot in urine). Finished treatment.  Midway through Sept/Oct cycle, started passing blood clots. 2 days ago was spotting.  Made appt for tomorrow .  Started throwing up in classroom.  Started heavy bleeding, went through a tampon in 2 hrs.  This is 5 days early  for cycle. Pain in lower back, every time it comes- she feels nauseous.   LMP: 9/29 Onset of complaint: ongoing issues with bleeding- started again today. Pain score: lower back, 4 Vitals:   10/16/23 1901  BP: 139/88  Pulse: 78  Resp: 18  Temp: 98.2 F (36.8 C)  SpO2: 100%      Lab orders placed from triage:  UPT

## 2023-10-16 NOTE — MAU Note (Signed)
Wynelle Bourgeois CNM in [facility] RM to see pt and discuss plan of care.

## 2024-05-20 DIAGNOSIS — R350 Frequency of micturition: Secondary | ICD-10-CM | POA: Diagnosis not present

## 2024-05-20 DIAGNOSIS — F411 Generalized anxiety disorder: Secondary | ICD-10-CM | POA: Diagnosis not present

## 2024-05-20 DIAGNOSIS — Z3200 Encounter for pregnancy test, result unknown: Secondary | ICD-10-CM | POA: Diagnosis not present

## 2024-05-20 DIAGNOSIS — N898 Other specified noninflammatory disorders of vagina: Secondary | ICD-10-CM | POA: Diagnosis not present

## 2024-05-20 DIAGNOSIS — E559 Vitamin D deficiency, unspecified: Secondary | ICD-10-CM | POA: Diagnosis not present

## 2024-05-20 DIAGNOSIS — F319 Bipolar disorder, unspecified: Secondary | ICD-10-CM | POA: Diagnosis not present

## 2024-05-20 DIAGNOSIS — E66813 Obesity, class 3: Secondary | ICD-10-CM | POA: Diagnosis not present

## 2024-05-20 DIAGNOSIS — R109 Unspecified abdominal pain: Secondary | ICD-10-CM | POA: Diagnosis not present

## 2024-05-20 DIAGNOSIS — Z6841 Body Mass Index (BMI) 40.0 and over, adult: Secondary | ICD-10-CM | POA: Diagnosis not present

## 2024-05-20 DIAGNOSIS — R7303 Prediabetes: Secondary | ICD-10-CM | POA: Diagnosis not present

## 2024-05-21 DIAGNOSIS — Z6841 Body Mass Index (BMI) 40.0 and over, adult: Secondary | ICD-10-CM | POA: Insufficient documentation

## 2024-06-03 ENCOUNTER — Emergency Department (HOSPITAL_COMMUNITY)

## 2024-06-03 ENCOUNTER — Emergency Department (HOSPITAL_COMMUNITY)
Admission: EM | Admit: 2024-06-03 | Discharge: 2024-06-04 | Disposition: A | Discharge status: Home / Self Care | Attending: Emergency Medicine | Admitting: Emergency Medicine

## 2024-06-03 ENCOUNTER — Encounter (HOSPITAL_COMMUNITY): Payer: Self-pay | Admitting: Emergency Medicine

## 2024-06-03 DIAGNOSIS — R404 Transient alteration of awareness: Secondary | ICD-10-CM | POA: Diagnosis not present

## 2024-06-03 DIAGNOSIS — E119 Type 2 diabetes mellitus without complications: Secondary | ICD-10-CM | POA: Diagnosis not present

## 2024-06-03 DIAGNOSIS — R569 Unspecified convulsions: Secondary | ICD-10-CM | POA: Diagnosis not present

## 2024-06-03 DIAGNOSIS — R0789 Other chest pain: Secondary | ICD-10-CM | POA: Diagnosis not present

## 2024-06-03 DIAGNOSIS — J45909 Unspecified asthma, uncomplicated: Secondary | ICD-10-CM | POA: Insufficient documentation

## 2024-06-03 DIAGNOSIS — Z7984 Long term (current) use of oral hypoglycemic drugs: Secondary | ICD-10-CM | POA: Insufficient documentation

## 2024-06-03 DIAGNOSIS — R079 Chest pain, unspecified: Secondary | ICD-10-CM | POA: Insufficient documentation

## 2024-06-03 DIAGNOSIS — R9431 Abnormal electrocardiogram [ECG] [EKG]: Secondary | ICD-10-CM | POA: Diagnosis not present

## 2024-06-03 LAB — CBC WITH DIFFERENTIAL/PLATELET
Abs Immature Granulocytes: 0.01 10*3/uL (ref 0.00–0.07)
Basophils Absolute: 0.1 10*3/uL (ref 0.0–0.1)
Basophils Relative: 1 %
Eosinophils Absolute: 0.3 10*3/uL (ref 0.0–0.5)
Eosinophils Relative: 4 %
HCT: 43.3 % (ref 36.0–46.0)
Hemoglobin: 14.3 g/dL (ref 12.0–15.0)
Immature Granulocytes: 0 %
Lymphocytes Relative: 46 %
Lymphs Abs: 3.3 10*3/uL (ref 0.7–4.0)
MCH: 30.2 pg (ref 26.0–34.0)
MCHC: 33 g/dL (ref 30.0–36.0)
MCV: 91.5 fL (ref 80.0–100.0)
Monocytes Absolute: 0.5 10*3/uL (ref 0.1–1.0)
Monocytes Relative: 7 %
Neutro Abs: 3 10*3/uL (ref 1.7–7.7)
Neutrophils Relative %: 42 %
Platelets: 299 10*3/uL (ref 150–400)
RBC: 4.73 MIL/uL (ref 3.87–5.11)
RDW: 12.2 % (ref 11.5–15.5)
WBC: 7 10*3/uL (ref 4.0–10.5)
nRBC: 0 % (ref 0.0–0.2)

## 2024-06-03 LAB — CBG MONITORING, ED: Glucose-Capillary: 107 mg/dL — ABNORMAL HIGH (ref 70–99)

## 2024-06-03 LAB — COMPREHENSIVE METABOLIC PANEL WITH GFR
ALT: 18 U/L (ref 0–44)
AST: 18 U/L (ref 15–41)
Albumin: 4.1 g/dL (ref 3.5–5.0)
Alkaline Phosphatase: 61 U/L (ref 38–126)
Anion gap: 8 (ref 5–15)
BUN: 10 mg/dL (ref 6–20)
CO2: 23 mmol/L (ref 22–32)
Calcium: 9 mg/dL (ref 8.9–10.3)
Chloride: 108 mmol/L (ref 98–111)
Creatinine, Ser: 0.76 mg/dL (ref 0.44–1.00)
GFR, Estimated: >60 mL/min (ref >60)
Glucose, Bld: 116 mg/dL — ABNORMAL HIGH (ref 70–99)
Potassium: 3.6 mmol/L (ref 3.5–5.1)
Sodium: 139 mmol/L (ref 135–145)
Total Bilirubin: 1 mg/dL (ref 0.0–1.2)
Total Protein: 7.3 g/dL (ref 6.5–8.1)

## 2024-06-03 LAB — TROPONIN I (HIGH SENSITIVITY): Troponin I (High Sensitivity): <2 ng/L

## 2024-06-03 LAB — HCG, QUANTITATIVE, PREGNANCY: hCG, Beta Chain, Quant, S: <1 m[IU]/mL

## 2024-06-03 NOTE — ED Triage Notes (Addendum)
 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 Little sleep and fever and blank seizure. She has prediabetes and has issues with low cbg. She was rocking her head and unresponsive and not saying anything during this episode witnessed by staff. They wanted to call ambulance then but states she just wanted to rest. She states now she is feeling tired but when she would go to sleep, heart would start pounding. Is bipolar but not taking her meds.

## 2024-06-03 NOTE — ED Notes (Signed)
 Urine collected and sent to lab.

## 2024-06-04 ENCOUNTER — Other Ambulatory Visit: Payer: Self-pay

## 2024-06-04 ENCOUNTER — Emergency Department (HOSPITAL_BASED_OUTPATIENT_CLINIC_OR_DEPARTMENT_OTHER)
Admission: EM | Admit: 2024-06-04 | Discharge: 2024-06-05 | Disposition: A | Discharge status: Home / Self Care | Attending: Emergency Medicine | Admitting: Emergency Medicine

## 2024-06-04 DIAGNOSIS — R9431 Abnormal electrocardiogram [ECG] [EKG]: Secondary | ICD-10-CM | POA: Diagnosis not present

## 2024-06-04 DIAGNOSIS — E119 Type 2 diabetes mellitus without complications: Secondary | ICD-10-CM | POA: Insufficient documentation

## 2024-06-04 DIAGNOSIS — J45909 Unspecified asthma, uncomplicated: Secondary | ICD-10-CM | POA: Insufficient documentation

## 2024-06-04 DIAGNOSIS — R079 Chest pain, unspecified: Secondary | ICD-10-CM | POA: Diagnosis not present

## 2024-06-04 DIAGNOSIS — R569 Unspecified convulsions: Secondary | ICD-10-CM | POA: Diagnosis not present

## 2024-06-04 DIAGNOSIS — R404 Transient alteration of awareness: Secondary | ICD-10-CM | POA: Diagnosis not present

## 2024-06-04 DIAGNOSIS — F319 Bipolar disorder, unspecified: Secondary | ICD-10-CM | POA: Diagnosis not present

## 2024-06-04 LAB — TROPONIN I (HIGH SENSITIVITY): Troponin I (High Sensitivity): <2 ng/L (ref <18)

## 2024-06-04 NOTE — ED Provider Notes (Signed)
 [place] EMERGENCY DEPARTMENT AT [facility] Provider Note   CSN: [id number] Arrival date & time: 06/03/24  1956     Patient presents with: Chest Pain   Jordan Williams is a 28 y.o. female past medical history significant for anxiety, asthma, diabetes, PCOS, and bipolar 1 presents today for heart palpitations.  Patient reports not taking her bipolar medications.  Patient is states that the patient is absence seizure's episodes where she stares off and rocks back-and-forth.  Patient states she has had little sleep and is under increased stress.  Patient denies shortness of breath, fever, chills, nausea, vomiting, history of seizures, diarrhea, or abdominal pain.    Chest Pain      Prior to Admission medications   Medication Sig Start Date End Date Taking? Authorizing Provider  carbamide peroxide (DEBROX) 6.5 % OTIC solution Instill 5 to 10 drops of the 6.5% otic liquid into the affected ear(s) 2 times daily for up to 4 days if needed 02/09/23   Harlow Lighter, [place]  N, FNP  ibuprofen  (ADVIL ) 800 MG tablet Take 1 tablet (800 mg total) by mouth every 6 (six) hours as needed for moderate pain. 02/03/23   Ballard Bongo, MD  lamoTRIgine  (LAMICTAL ) 25 MG tablet Take 150 mg by mouth at bedtime. 04/28/20   [provider]  loperamide  (IMODIUM ) 2 MG capsule Take 2 capsules (4 mg total) by mouth 2 (two) times daily as needed for diarrhea or loose stools. 05/16/23   Deatra Face, MD  metFORMIN  (GLUCOPHAGE ) 1000 MG tablet Take 1 tablet (1,000 mg total) by mouth 2 (two) times daily with a meal. 02/02/22   Rasch, Juliette Oh, NP  methocarbamol  (ROBAXIN ) 500 MG tablet Take 1 tablet (500 mg total) by mouth every 8 (eight) hours as needed for muscle spasms. 02/03/23   Ballard Bongo, MD  ondansetron  (ZOFRAN -ODT) 8 MG disintegrating tablet Take 1 tablet (8 mg total) by mouth every 8 (eight) hours as needed for nausea. 05/16/23   Nanavati, Ankit, MD  sertraline (ZOLOFT) 50 MG  tablet Take 50 mg by mouth daily. 06/23/20   [provider]  tranexamic acid  (LYSTEDA ) 650 MG TABS tablet Take 2 tablets (1,300 mg total) by mouth every 8 (eight) hours as needed (heavy bleeding). Take during menses for a maximum of five days 10/16/23   Harlee Lichtenstein, CNM    Allergies: Patient has no known allergies.    Review of Systems  Cardiovascular:  Positive for chest pain.  Neurological:  Positive for seizures.    Updated Vital Signs BP (!) 161/116   Pulse 77   Temp 98.3 F (36.8 C) (Oral)   Resp 16   Ht 5' 2 (1.575 m)   Wt 106.6 kg   LMP 06/01/2024   SpO2 100%   BMI 42.98 kg/m   Physical Exam Vitals and nursing note reviewed.  Constitutional:      General: She is not in acute distress.    Appearance: She is well-developed. She is obese. She is not ill-appearing or toxic-appearing.  HENT:     Head: Normocephalic and atraumatic.   Eyes:     Extraocular Movements: Extraocular movements intact.     Conjunctiva/sclera: Conjunctivae normal.    Cardiovascular:     Rate and Rhythm: Normal rate and regular rhythm.     Heart sounds: No murmur heard. Pulmonary:     Effort: Pulmonary effort is normal. No respiratory distress.     Breath sounds: Normal breath sounds.  Abdominal:  Palpations: Abdomen is soft.     Tenderness: There is no abdominal tenderness.   Musculoskeletal:        General: No swelling.     Cervical back: Neck supple.   Skin:    General: Skin is warm and dry.     Capillary Refill: Capillary refill takes less than 2 seconds.   Neurological:     General: No focal deficit present.     Mental Status: She is alert and oriented to person, place, and time.   Psychiatric:        Mood and Affect: Affect is blunt.     (all labs ordered are listed, but only abnormal results are displayed) Labs Reviewed  COMPREHENSIVE METABOLIC PANEL WITH GFR - Abnormal; Notable for the following components:      Result Value   Glucose, Bld 116 (*)     All other components within normal limits  CBG MONITORING, ED - Abnormal; Notable for the following components:   Glucose-Capillary 107 (*)    All other components within normal limits  CBC WITH DIFFERENTIAL/PLATELET  HCG, QUANTITATIVE, PREGNANCY  URINALYSIS, ROUTINE W REFLEX MICROSCOPIC  TROPONIN I (HIGH SENSITIVITY)  TROPONIN I (HIGH SENSITIVITY)    EKG: EKG Interpretation Date/Time:  Thursday June 03 2024 20:17:43 EDT Ventricular Rate:  108 PR Interval:  138 QRS Duration:  86 QT Interval:  293 QTC Calculation: 393 R Axis:   62  Text Interpretation: Sinus tachycardia Confirmed by Kelsey Patricia [phone]) on 06/04/2024 12:42:12 AM  Radiology: Lenell Query Chest 2 View Result Date: 06/03/2024 CLINICAL DATA:  Chest pain, irregular heartbeat, shortness of breath. EXAM: CHEST - 2 VIEW COMPARISON:  PA Lat chest 02/02/2023, CTA chest 02/03/2023 FINDINGS: The heart size and mediastinal contours are within normal limits. Both lungs are clear. The visualized skeletal structures are unremarkable apart from a slight reverse S shaped thoracic scoliosis. IMPRESSION: No evidence of acute chest disease. Slight reverse S shaped thoracic scoliosis. Stable chest. Electronically Signed   By: Denman Fischer M.D.   On: 06/03/2024 20:56     Procedures   Medications Ordered in the ED - No data to display                                  Medical Decision Making Amount and/or Complexity of Data Reviewed Labs: ordered. Radiology: ordered.   This patient presents to the ED for concern of chest pain and seizures, this involves an extensive number of treatment options, and is a complaint that carries with it a high risk of complications and morbidity.  The differential diagnosis includes anxiety, STEMI, NSTEMI, arrhythmia, electrolyte abnormality, anemia, seizure, pseudoseizure, hypoglycemia, hyperglycemia, DKA, HHS   Co morbidities / Chronic conditions that complicate the patient evaluation  Bipolar 1,  diabetes   Lab Tests:  I Ordered, and personally interpreted labs.  The pertinent results include: CMP and CBC WNL, negative hCG, delta troponin less than 2   Imaging Studies ordered:  I ordered imaging studies including chest x-ray I independently visualized and interpreted imaging which showed no evidence of acute chest disease I agree with the radiologist interpretation   Cardiac Monitoring: / EKG:  The patient was maintained on a cardiac monitor.  I personally viewed and interpreted the cardiac monitored which showed an underlying rhythm of: Sinus tachycardia  Test / Admission - Considered:  Considered for admission and further workup however patient's vital signs, physical exam, labs, and imaging  of been reassuring.  Patient to follow-up with neurology outpatient for transient episodes of alteration of awareness.  Patient given return precautions.  I feel patient safe for discharge at this time.     Final diagnoses:  Transient alteration of awareness  Chest pain, unspecified type    ED Discharge Orders          Ordered    Ambulatory referral to Neurology       Comments: An appointment is requested in approximately: 2 weeks   06/04/24 0121               Carie Charity, PA-C 06/04/24 0123    Kelsey Patricia, MD 06/04/24 [phone]

## 2024-06-04 NOTE — Discharge Instructions (Addendum)
 Today you were seen for chest pain and zoning out episodes.  Please follow-up with neurology outpatient for further evaluation and follow-up.  Please do not drive until cleared by neurology.  Thank you for letting us  treat you today. After reviewing your labs and imaging, I feel you are safe to go home. Please follow up with your PCP in the next several days and provide them with your records from this visit. Return to the Emergency Room if pain becomes severe or symptoms worsen.

## 2024-06-04 NOTE — ED Triage Notes (Signed)
 Pt was seen yesterday at [facility] long for absent seizures. Spoke with her PCP who felt a CT may be needed. Today reports tingling to posterior head and neck. Family reported other seizure-like episodes at home

## 2024-06-05 ENCOUNTER — Telehealth (HOSPITAL_BASED_OUTPATIENT_CLINIC_OR_DEPARTMENT_OTHER): Payer: Self-pay | Admitting: Emergency Medicine

## 2024-06-05 ENCOUNTER — Emergency Department (HOSPITAL_BASED_OUTPATIENT_CLINIC_OR_DEPARTMENT_OTHER)

## 2024-06-05 DIAGNOSIS — R569 Unspecified convulsions: Secondary | ICD-10-CM | POA: Diagnosis not present

## 2024-06-05 DIAGNOSIS — R079 Chest pain, unspecified: Secondary | ICD-10-CM | POA: Diagnosis not present

## 2024-06-05 MED ORDER — LEVETIRACETAM 500 MG PO TABS: 500.0000 mg | ORAL_TABLET | Freq: Two times a day (BID) | ORAL | 1 refills | Status: DC

## 2024-06-05 MED ORDER — LEVETIRACETAM 500 MG PO TABS
1500.0000 mg | ORAL_TABLET | Freq: Once | ORAL | Status: AC
  Administered 2024-06-05: 1500 mg via ORAL
  Filled 2024-06-05: qty 3

## 2024-06-05 NOTE — Plan of Care (Signed)
 Overnight on-call neurology note  Called by Dr. Harless Lien at [facility] regarding patient with concern for seizure-like activity-multiple episodes in the last couple of days.  Head-bobbing and eyelid fluttering-question seizure versus PNES. Recommend starting antiepileptic-possibly lamotrigine  which would have good effect for both mood and seizures but she has tried it in the past for mood and does not like how it makes her feel.  CT and labs unremarkable.  I would recommend starting her on Keppra 500 twice daily and having her follow-up outpatient-with consideration for outpatient referral for EMU if she continues to have these spells for spell characterization. I would recommend follow-up with [facility] neurology, Dr. Samara Crest.  -- Tona Francis, MD Neurologist [facility]    SEIZURE PRECAUTIONS Per [place]  DMV statutes, patients with seizures are not allowed to drive until they have been seizure-free for six months.   Use caution when using heavy equipment or power tools. Avoid working on ladders or at heights. Take showers instead of baths. Ensure the water temperature is not too high on the home water heater. Do not go swimming alone. Do not lock yourself in a room alone (i.e. bathroom). When caring for infants or small children, sit down when holding, feeding, or changing them to minimize risk of injury to the child in the event you have a seizure. Maintain good sleep hygiene. Avoid alcohol.    If patient has another seizure, call 911 and bring them back to the ED if: A.  The seizure lasts longer than 5 minutes.      B.  The patient doesn't wake shortly after the seizure or has new problems such as difficulty seeing, speaking or moving following the seizure C.  The patient was injured during the seizure D.  The patient has a temperature over 102 F (39C) E.  The patient vomited during the seizure and now is having trouble breathing

## 2024-06-05 NOTE — Telephone Encounter (Signed)
 Called requesting that Keppra be sent to [facility] on [address].  Pharmacy updated.  Medication sent to this location.

## 2024-06-05 NOTE — ED Provider Notes (Signed)
 [facility]GENC[facility] Emergency Department Provider Note MRN:  [id number]  Arrival date & time: 06/05/24     Chief Complaint   Seizures   History of Present Illness   Jordan Williams is a 28 y.o. year-old female with a history of bipolar disorder presenting to the ED with chief complaint of seizures.  Patient has had 5 seizures over the past 48 hours.  Prior to this has never had a seizure.  Here this evening for a CT scan.  Was informed by her primary care doctor that she probably should have had a CT scan during her initial emergency department visit yesterday.  Having further spells today.  Yesterday was having brief episodes of unresponsiveness with matting of the eyes.  Today having had twitching episodes again with unresponsiveness followed by fatigue or sleepiness.  Review of Systems  A thorough review of systems was obtained and all systems are negative except as noted in the HPI and PMH.   Patient's Health History    Past Medical History:  Diagnosis Date   Anxiety    Asthma    when young, no meds currently   Bipolar 1 disorder (HCC)    Depression    PCOS (polycystic ovarian syndrome)    Trichomoniasis    Type 2 diabetes mellitus (HCC)     Past Surgical History:  Procedure Laterality Date   CESAREAN SECTION N/A 02/25/2022   Procedure: CESAREAN SECTION;  Surgeon: Concepcion Deck, MD;  Location: [facility] LD ORS;  Service: Obstetrics;  Laterality: N/A;   NO PAST SURGERIES      Family History  Problem Relation Age of Onset   Healthy Mother    Healthy Father     Social History   Socioeconomic History   Marital status: Single    Spouse name: Not on file   Number of children: Not on file   Years of education: Not on file   Highest education level: Not on file  Occupational History   Not on file  Tobacco Use   Smoking status: Never   Smokeless tobacco: Never  Vaping Use   Vaping status: Never Used  Substance and Sexual Activity   Alcohol use: Not  Currently    Comment: occ   Drug use: Never   Sexual activity: Yes    Birth control/protection: None  Other Topics Concern   Not on file  Social History Narrative   Not on file   Social Drivers of Health   Financial Resource Strain: Low Risk  (03/30/2024)   Received from [facility]   Overall Financial Resource Strain (CARDIA)    Difficulty of Paying Living Expenses: Not hard at all  Food Insecurity: No Food Insecurity (03/30/2024)   Received from [facility]   Hunger Vital Sign    Within the past 12 months, you worried that your food would run out before you got the money to buy more.: Never true    Within the past 12 months, the food you bought just didn't last and you didn't have money to get more.: Never true  Transportation Needs: No Transportation Needs (03/30/2024)   Received from [facility] - Transportation    Lack of Transportation (Medical): No    Lack of Transportation (Non-Medical): No  Physical Activity: Insufficiently Active (03/30/2024)   [facility] from [facility]   Exercise Vital Sign    On average, how many days per week do you engage in moderate to strenuous exercise (like a brisk walk)?:  3 days    On average, how many minutes do you engage in exercise at this level?: 30 min  Stress: No Stress Concern Present (03/30/2024)   Received from [facility] - Occupational Stress Questionnaire    Feeling of Stress : Not at all  Social Connections: Socially Integrated (03/30/2024)   Received from [facility]   Social Network    How would you rate your social network (family, work, friends)?: Good participation with social networks  Intimate Partner Violence: Not At Risk (03/30/2024)   Received from [facility]   HITS    Over the last 12 months how often did your partner physically hurt you?: Never    Over the last 12 months how often did your partner insult you or talk down to you?: Never    Over the last 12  months how often did your partner threaten you with physical harm?: Never    Over the last 12 months how often did your partner scream or curse at you?: Never     Physical Exam   Vitals:   06/04/24 2333  BP: 133/87  Pulse: 77  Resp: 16  Temp: 98.5 F (36.9 C)  SpO2: 100%    CONSTITUTIONAL: Well-appearing, NAD NEURO/PSYCH:  Alert and oriented x 3, normal and symmetric strength and sensation, normal coordination, normal speech EYES:  eyes equal and reactive ENT/NECK:  no LAD, no JVD CARDIO: Regular rate, well-perfused, normal S1 and S2 PULM:  CTAB no wheezing or rhonchi GI/GU:  non-distended, non-tender MSK/SPINE:  No gross deformities, no edema SKIN:  no rash, atraumatic   *Additional and/or pertinent findings included in MDM below  Diagnostic and Interventional Summary    EKG Interpretation Date/Time:  Friday June 04 2024 23:49:51 EDT Ventricular Rate:  78 PR Interval:  151 QRS Duration:  98 QT Interval:  365 QTC Calculation: 416 R Axis:   57  Text Interpretation: Sinus rhythm RSR' in V1 or V2, right VCD or RVH Confirmed by Gwenetta Lennert [phone]) on 06/05/2024 12:47:48 AM       Labs Reviewed - No data to display  CT HEAD WO CONTRAST ( )  Final Result      Medications  levETIRAcetam (KEPPRA) tablet 1,500 mg (1,500 mg Oral Given 06/05/24 0135)     Procedures  /  Critical Care Procedures  ED Course and Medical Decision Making  Initial Impression and Ddx  New onset seizures, question epileptic versus nonepileptic.  Vitals normal, no complaints at this time other than some mild fatigue, possibly postictal.  Has had no brain imaging, will need CT to exclude obvious structural cause of the seizures.  Past medical/surgical history that increases complexity of ED encounter: Bipolar disorder  Interpretation of Diagnostics I personally reviewed the EKG and my interpretation is as follows: Sinus rhythm  CT head normal  Patient Reassessment and Ultimate  Disposition/Management     Case discussed with Dr. Bonnita Buttner of neurology.  Appropriate for discharge on Keppra, has referral to neurology.  Patient management required discussion with the following services or consulting groups:  Neurology  Complexity of Problems Addressed Acute illness or injury that poses threat of life of bodily function  Additional Data Reviewed and Analyzed Further history obtained from: Further history from spouse/family member  Additional Factors Impacting ED Encounter Risk Consideration of hospitalization  Merrick Abe. Harless Lien, MD [facility] Emergency Medicine [facility] [email or web address]  Final Clinical Impressions(s) / ED Diagnoses     ICD-10-CM  1. Seizure-like activity (HCC)  R56.9       ED Discharge Orders          Ordered    levETIRAcetam (KEPPRA) 500 MG tablet  2 times daily        06/05/24 0145             Discharge Instructions Discussed with and Provided to Patient:    Discharge Instructions      You were evaluated in the Emergency Department and after careful evaluation, we did not find any emergent condition requiring admission or further testing in the hospital.  Your exam/testing today was overall reassuring.  CT scan was normal.  Keep the follow-up with neurology, take the Keppra antiseizure medication twice daily as prescribed.  Please return to the Emergency Department if you experience any worsening of your condition.  Thank you for allowing us  to be a part of your care.  SEIZURE PRECAUTIONS Per [place]  DMV statutes, patients with seizures are not allowed to drive until they have been seizure-free for six months.   Use caution when using heavy equipment or power tools. Avoid working on ladders or at heights. Take showers instead of baths. Ensure the water temperature is not too high on the home water heater. Do not go swimming alone. Do not lock yourself in a room alone (i.e. bathroom). When caring  for infants or small children, sit down when holding, feeding, or changing them to minimize risk of injury to the child in the event you have a seizure. Maintain good sleep hygiene. Avoid alcohol.         Edson Graces, MD 06/05/24 [phone]

## 2024-06-05 NOTE — Discharge Instructions (Addendum)
 You were evaluated in the Emergency Department and after careful evaluation, we did not find any emergent condition requiring admission or further testing in the hospital.  Your exam/testing today was overall reassuring.  CT scan was normal.  Keep the follow-up with neurology, take the Keppra antiseizure medication twice daily as prescribed.  Please return to the Emergency Department if you experience any worsening of your condition.  Thank you for allowing us  to be a part of your care.  SEIZURE PRECAUTIONS Per [place]  DMV statutes, patients with seizures are not allowed to drive until they have been seizure-free for six months.   Use caution when using heavy equipment or power tools. Avoid working on ladders or at heights. Take showers instead of baths. Ensure the water temperature is not too high on the home water heater. Do not go swimming alone. Do not lock yourself in a room alone (i.e. bathroom). When caring for infants or small children, sit down when holding, feeding, or changing them to minimize risk of injury to the child in the event you have a seizure. Maintain good sleep hygiene. Avoid alcohol.

## 2024-06-08 ENCOUNTER — Encounter: Payer: Self-pay | Admitting: Neurology

## 2024-06-08 ENCOUNTER — Ambulatory Visit (INDEPENDENT_AMBULATORY_CARE_PROVIDER_SITE_OTHER): Admitting: Neurology

## 2024-06-08 VITALS — BP 108/75 | HR 78 | Ht 63.0 in | Wt 232.0 lb

## 2024-06-08 DIAGNOSIS — R7303 Prediabetes: Secondary | ICD-10-CM | POA: Diagnosis not present

## 2024-06-08 DIAGNOSIS — E66812 Obesity, class 2: Secondary | ICD-10-CM | POA: Insufficient documentation

## 2024-06-08 DIAGNOSIS — R569 Unspecified convulsions: Secondary | ICD-10-CM

## 2024-06-08 DIAGNOSIS — E282 Polycystic ovarian syndrome: Secondary | ICD-10-CM | POA: Insufficient documentation

## 2024-06-08 DIAGNOSIS — E119 Type 2 diabetes mellitus without complications: Secondary | ICD-10-CM | POA: Insufficient documentation

## 2024-06-08 DIAGNOSIS — N939 Abnormal uterine and vaginal bleeding, unspecified: Secondary | ICD-10-CM | POA: Diagnosis not present

## 2024-06-08 DIAGNOSIS — F3160 Bipolar disorder, current episode mixed, unspecified: Secondary | ICD-10-CM | POA: Insufficient documentation

## 2024-06-08 DIAGNOSIS — E7800 Pure hypercholesterolemia, unspecified: Secondary | ICD-10-CM | POA: Insufficient documentation

## 2024-06-08 DIAGNOSIS — D509 Iron deficiency anemia, unspecified: Secondary | ICD-10-CM | POA: Insufficient documentation

## 2024-06-08 DIAGNOSIS — L680 Hirsutism: Secondary | ICD-10-CM | POA: Diagnosis not present

## 2024-06-08 MED ORDER — LAMOTRIGINE ER 50 MG PO TB24: 50.0000 mg | ORAL_TABLET | Freq: Every day | ORAL | 3 refills | Status: AC

## 2024-06-08 NOTE — Patient Instructions (Signed)
 Discontinue Levetiracetam Start lamotrigine  XR 50 mg daily Follow-up with your psychiatrist for management of bipolar disorder Return as needed

## 2024-06-08 NOTE — Progress Notes (Signed)
 [facility]  PATIENT: Jordan Williams DOB: Feb 02, 1996  REQUESTING CLINICIAN: Carie Charity, PA-C HISTORY FROM: Patient and friend  REASON FOR VISIT: Seizure like activity    HISTORICAL  CHIEF COMPLAINT:  Chief Complaint  Patient presents with   RM13/SEIZURES    Pt is here with her Son and Radio broadcast assistant. Pt states that she had blank seizures. Pt states she has Absent seizures. Pt states that her last seizure was last night. Pt states that she has been to the hospital twice. Pt states that her mom has TD. Pt states that her Aunt has Seizures. Pt states that she was on Lamotrigine  150 mg for Bipolar Disorder. Pt is now taking Keppra 500 mg 2x daily and she is still having seizures while on her Keppra. Pt states would like to discuss getting a MRI.   Cont...    Pt would like to discuss if CBD can be used as a treatment.     HISTORY OF PRESENT ILLNESS:   This is a 28 year old woman past medical history of bipolar disorder, anxiety, depression, asthma who is presenting for evaluation of seizure like activity.  Patient reports her episode started last Thursday, described them as staring spell and have another episode of head shaking.  She provided a video when she was in the ED, in one episode we can see her sitting on the gurney, eyes rolled back, no other abnormal movement, appears to be unresponsive.  There was also another video where she was leaning her head against the wall and having a rhythmic movement of her head about 4 to 5 Hz.  Again no stiffness, no tonic-clonic activity, no tongue biting, no urinary incontinence, no injury and these episodes are consistent with nonepileptic spells. She tells me with her bipolar disorder, she was on lamotrigine  for the past 8 years but discontinued 2 months ago because of side effect, tells me Lamotrigine  was not making her feel well, she did not discussed with her psychiatrist prior to discontinuing the medication.   She tells me lately  she has been under a lot of stress, her son was kicked out of daycare, she was cleaning her classroom, she is a second grade teacher, there was some increased stress related to her family friend having a heart attack while in the third trimester, the baby was delivered and in the NICU.  Currently she is not taking any medications for her depression or anxiety or bipolar disorder.  In her second visit to the ED, she was started on levetiracetam 500 mg twice daily   OTHER MEDICAL CONDITIONS: bipolar disorder, Anxiety, Depression   REVIEW OF SYSTEMS: Full 14 system review of systems performed and negative with exception of: As noted in the HPI   ALLERGIES: No Known Allergies  HOME MEDICATIONS: Outpatient Medications Prior to Visit  Medication Sig Dispense Refill   levETIRAcetam (KEPPRA) 500 MG tablet Take 1 tablet (500 mg total) by mouth 2 (two) times daily. 60 tablet 1   carbamide peroxide (DEBROX) 6.5 % OTIC solution Instill 5 to 10 drops of the 6.5% otic liquid into the affected ear(s) 2 times daily for up to 4 days if needed (Patient not taking: Reported on 06/08/2024) 15 mL 0   ibuprofen  (ADVIL ) 800 MG tablet Take 1 tablet (800 mg total) by mouth every 6 (six) hours as needed for moderate pain. (Patient not taking: Reported on 06/08/2024) 20 tablet 0   lamoTRIgine  (LAMICTAL ) 25 MG tablet Take 150 mg by mouth at bedtime. (Patient  not taking: Reported on 06/08/2024)     loperamide  (IMODIUM ) 2 MG capsule Take 2 capsules (4 mg total) by mouth 2 (two) times daily as needed for diarrhea or loose stools. (Patient not taking: Reported on 06/08/2024) 12 capsule 0   metFORMIN  (GLUCOPHAGE ) 1000 MG tablet Take 1 tablet (1,000 mg total) by mouth 2 (two) times daily with a meal. (Patient not taking: Reported on 06/08/2024) 90 tablet 1   methocarbamol  (ROBAXIN ) 500 MG tablet Take 1 tablet (500 mg total) by mouth every 8 (eight) hours as needed for muscle spasms. (Patient not taking: Reported on 06/08/2024) 20 tablet  0   ondansetron  (ZOFRAN -ODT) 8 MG disintegrating tablet Take 1 tablet (8 mg total) by mouth every 8 (eight) hours as needed for nausea. (Patient not taking: Reported on 06/08/2024) 20 tablet 0   sertraline (ZOLOFT) 50 MG tablet Take 50 mg by mouth daily. (Patient not taking: Reported on 06/08/2024)     tranexamic acid  (LYSTEDA ) 650 MG TABS tablet Take 2 tablets (1,300 mg total) by mouth every 8 (eight) hours as needed (heavy bleeding). Take during menses for a maximum of five days (Patient not taking: Reported on 06/08/2024) 30 tablet 2   No facility-administered medications prior to visit.    PAST MEDICAL HISTORY: Past Medical History:  Diagnosis Date   Anxiety    Asthma    when young, no meds currently   Bipolar 1 disorder (HCC)    Depression    PCOS (polycystic ovarian syndrome)    Trichomoniasis    Type 2 diabetes mellitus (HCC)     PAST SURGICAL HISTORY: Past Surgical History:  Procedure Laterality Date   CESAREAN SECTION N/A 02/25/2022   Procedure: CESAREAN SECTION;  Surgeon: Concepcion Deck, MD;  Location: [facility] LD ORS;  Service: Obstetrics;  Laterality: N/A;   NO PAST SURGERIES      FAMILY HISTORY: Family History  Problem Relation Age of Onset   Healthy Mother    Healthy Father     SOCIAL HISTORY: Social History   Socioeconomic History   Marital status: Single    Spouse name: Not on file   Number of children: 1   Years of education: Not on file   Highest education level: Not on file  Occupational History   Not on file  Tobacco Use   Smoking status: Never   Smokeless tobacco: Never  Vaping Use   Vaping status: Never Used  Substance and Sexual Activity   Alcohol use: Not Currently    Comment: occ   Drug use: Never   Sexual activity: Yes    Birth control/protection: None  Other Topics Concern   Not on file  Social History Narrative   Not on file   Social Drivers of Health   Financial Resource Strain: Low Risk  (03/30/2024)   Received from [facility]   Overall Financial Resource Strain (CARDIA)    Difficulty of Paying Living Expenses: Not hard at all  Food Insecurity: No Food Insecurity (03/30/2024)   Received from [facility]   Hunger Vital Sign    Within the past 12 months, you worried that your food would run out before you got the money to buy more.: Never true    Within the past 12 months, the food you bought just didn't last and you didn't have money to get more.: Never true  Transportation Needs: No Transportation Needs (03/30/2024)   Received from [facility] - Transportation    Lack of Transportation (  Medical): No    Lack of Transportation (Non-Medical): No  Physical Activity: Insufficiently Active (03/30/2024)   Received from [facility]   Exercise Vital Sign    On average, how many days per week do you engage in moderate to strenuous exercise (like a brisk walk)?: 3 days    On average, how many minutes do you engage in exercise at this level?: 30 min  Stress: No Stress Concern Present (03/30/2024)   Received from [facility] - Occupational Stress Questionnaire    Feeling of Stress : Not at all  Social Connections: Socially Integrated (03/30/2024)   Received from [facility]   Social Network    How would you rate your social network (family, work, friends)?: Good participation with social networks  Intimate Partner Violence: Not At Risk (03/30/2024)   Received from [facility]   HITS    Over the last 12 months how often did your partner physically hurt you?: Never    Over the last 12 months how often did your partner insult you or talk down to you?: Never    Over the last 12 months how often did your partner threaten you with physical harm?: Never    Over the last 12 months how often did your partner scream or curse at you?: Never    PHYSICAL EXAM  GENERAL EXAM/CONSTITUTIONAL: Vitals:  Vitals:   06/08/24 0833  BP: 108/75  Pulse: 78  SpO2: 98%   Weight: 232 lb (105.2 kg)  Height: 5' 3 (1.6 m)   Body mass index is 41.1 kg/m. Wt Readings from Last 3 Encounters:  06/08/24 232 lb (105.2 kg)  06/03/24 235 lb (106.6 kg)  10/16/23 232 lb 12.8 oz (105.6 kg)   Patient is in no distress; well developed, nourished and groomed; neck is supple  MUSCULOSKELETAL: Gait, strength, tone, movements noted in Neurologic exam below  NEUROLOGIC: MENTAL STATUS:      No data to display         awake, alert, oriented to person, place and time recent and remote memory intact normal attention and concentration language fluent, comprehension intact, naming intact fund of knowledge appropriate  CRANIAL NERVE:  2nd, 3rd, 4th, 6th - Visual fields full to confrontation, extraocular muscles intact, no nystagmus 5th - facial sensation symmetric 7th - facial strength symmetric 8th - hearing intact 9th - palate elevates symmetrically, uvula midline 11th - shoulder shrug symmetric 12th - tongue protrusion midline  MOTOR:  normal bulk and tone, full strength in the BUE, BLE  SENSORY:  normal and symmetric to light touch  COORDINATION:  finger-nose-finger, fine finger movements normal  GAIT/STATION:  normal   DIAGNOSTIC DATA (LABS, IMAGING, TESTING) - I reviewed patient records, labs, notes, testing and imaging myself where available.  Lab Results  Component Value Date   WBC 7.0 06/03/2024   HGB 14.3 06/03/2024   HCT 43.3 06/03/2024   MCV 91.5 06/03/2024   PLT 299 06/03/2024      Component Value Date/Time   NA 139 06/03/2024 2034   K 3.6 06/03/2024 2034   CL 108 06/03/2024 2034   CO2 23 06/03/2024 2034   GLUCOSE 116 (H) 06/03/2024 2034   BUN 10 06/03/2024 2034   CREATININE 0.76 06/03/2024 2034   CALCIUM 9.0 06/03/2024 2034   PROT 7.3 06/03/2024 2034   ALBUMIN 4.1 06/03/2024 2034   AST 18 06/03/2024 2034   ALT 18 06/03/2024 2034   ALKPHOS 61 06/03/2024 2034  BILITOT 1.0 06/03/2024 2034   GFRNONAA >60 06/03/2024 2034    GFRAA >60 09/09/2020 0346   No results found for: CHOL, HDL, LDLCALC, LDLDIRECT, TRIG No results found for: HGBA1C No results found for: VITAMINB12 No results found for: TSH  Head CT 06/05/2024 1. No acute intracranial abnormality   I personally reviewed brain Images and previous EEG reports.   ASSESSMENT AND PLAN  28 y.o. year old female  with history of bipolar disorder, anxiety depression who is presenting for evaluation of seizure-like activity.  The episode started last week on Thursday, she presented to the ED on 6/13 and 6/14.  She provided a video of the event and these are nonepileptic spells.  Since these events are nonepileptic, I have advised her to discontinue levetiracetam and restart lamotrigine  at a lower dose to 50 mg daily advised her to follow-up with her psychiatrist for management of the bipolar disorder.  She was understanding.   1. Nonepileptic episode [facility])     Patient Instructions  Discontinue Levetiracetam Start lamotrigine  XR 50 mg daily Follow-up with your psychiatrist for management of bipolar disorder Return as needed   Per [place]  DMV statutes, patients with seizures are not allowed to drive until they have been seizure-free for six months.  Other recommendations include using caution when using heavy equipment or power tools. Avoid working on ladders or at heights. Take showers instead of baths.  Do not swim alone.  Ensure the water temperature is not too high on the home water heater. Do not go swimming alone. Do not lock yourself in a room alone (i.e. bathroom). When caring for infants or small children, sit down when holding, feeding, or changing them to minimize risk of injury to the child in the event you have a seizure. Maintain good sleep hygiene. Avoid alcohol.  Also recommend adequate sleep, hydration, good diet and minimize stress.   During the Seizure  - First, ensure adequate ventilation and place patients on the  floor on their left side  Loosen clothing around the neck and ensure the airway is patent. If the patient is clenching the teeth, do not force the mouth open with any object as this can cause severe damage - Remove all items from the surrounding that can be hazardous. The patient may be oblivious to what's happening and may not even know what he or she is doing. If the patient is confused and wandering, either gently guide him/her away and block access to outside areas - Reassure the individual and be comforting - Call 911. In most cases, the seizure ends before EMS arrives. However, there are cases when seizures may last over 3 to 5 minutes. Or the individual may have developed breathing difficulties or severe injuries. If a pregnant patient or a person with diabetes develops a seizure, it is prudent to call an ambulance. - Finally, if the patient does not regain full consciousness, then call EMS. Most patients will remain confused for about 45 to 90 minutes after a seizure, so you must use judgment in calling for help. - Avoid restraints but make sure the patient is in a bed with padded side rails - Place the individual in a lateral position with the neck slightly flexed; this will help the saliva drain from the mouth and prevent the tongue from falling backward - Remove all nearby furniture and other hazards from the area - Provide verbal assurance as the individual is regaining consciousness - Provide the patient with privacy if possible -  Call for help and start treatment as ordered by the caregiver   After the Seizure (Postictal Stage)  After a seizure, most patients experience confusion, fatigue, muscle pain and/or a headache. Thus, one should permit the individual to sleep. For the next few days, reassurance is essential. Being calm and helping reorient the person is also of importance.  Most seizures are painless and end spontaneously. Seizures are not harmful to others but can lead to  complications such as stress on the lungs, brain and the heart. Individuals with prior lung problems may develop labored breathing and respiratory distress.    Discussed Patients with epilepsy have a small risk of sudden unexpected death, a condition referred to as sudden unexpected death in epilepsy (SUDEP). SUDEP is defined specifically as the sudden, unexpected, witnessed or unwitnessed, nontraumatic and nondrowning death in patients with epilepsy with or without evidence for a seizure, and excluding documented status epilepticus, in which post mortem examination does not reveal a structural or toxicologic cause for death     No orders of the defined types were placed in this encounter.   Meds ordered this encounter  Medications   lamoTRIgine  (LAMICTAL  XR) 50 MG 24 hour tablet    Sig: Take 1 tablet (50 mg total) by mouth daily.    Dispense:  90 tablet    Refill:  3    Return if symptoms worsen or fail to improve.    Cassandra Cleveland, MD 06/08/2024, 9:51 AM  [facility] [address], [address] [place], [place] [address] [phone]

## 2024-06-15 DIAGNOSIS — Z6841 Body Mass Index (BMI) 40.0 and over, adult: Secondary | ICD-10-CM | POA: Diagnosis not present

## 2024-06-15 DIAGNOSIS — E66813 Obesity, class 3: Secondary | ICD-10-CM | POA: Diagnosis not present

## 2024-06-18 DIAGNOSIS — R404 Transient alteration of awareness: Secondary | ICD-10-CM | POA: Diagnosis not present

## 2024-06-18 DIAGNOSIS — R569 Unspecified convulsions: Secondary | ICD-10-CM | POA: Diagnosis not present

## 2024-07-09 DIAGNOSIS — F319 Bipolar disorder, unspecified: Secondary | ICD-10-CM | POA: Diagnosis not present

## 2024-07-09 DIAGNOSIS — F411 Generalized anxiety disorder: Secondary | ICD-10-CM | POA: Diagnosis not present

## 2024-07-09 DIAGNOSIS — R569 Unspecified convulsions: Secondary | ICD-10-CM | POA: Diagnosis not present

## 2024-07-09 DIAGNOSIS — Z6841 Body Mass Index (BMI) 40.0 and over, adult: Secondary | ICD-10-CM | POA: Diagnosis not present

## 2024-07-09 DIAGNOSIS — E559 Vitamin D deficiency, unspecified: Secondary | ICD-10-CM | POA: Diagnosis not present

## 2024-07-09 DIAGNOSIS — E66813 Obesity, class 3: Secondary | ICD-10-CM | POA: Diagnosis not present

## 2024-07-09 DIAGNOSIS — R7303 Prediabetes: Secondary | ICD-10-CM | POA: Diagnosis not present

## 2024-07-13 DIAGNOSIS — R569 Unspecified convulsions: Secondary | ICD-10-CM | POA: Diagnosis not present

## 2024-07-13 DIAGNOSIS — E119 Type 2 diabetes mellitus without complications: Secondary | ICD-10-CM | POA: Diagnosis not present

## 2024-07-13 NOTE — Progress Notes (Signed)
 [facility] [place] NEW PATIENT EVALUATION   Referring Physician:  Emilio Joesph Croak, * Primary Care Physician:  Christopher Bohr, NP   Patient ID:  Jordan Williams is a 28 y.o. (DOB 10/12/1996) female.    Subjective   Ms. Jordan Williams is a 28 y.o. female referred for evaluation. History of Present Illness The patient presents for seizures.  She has been experiencing seizures for approximately a month, which she believes are triggered by her menstrual cycle and ovulation, but also stress. During these episodes, she is aware of her surroundings but unable to respond. The seizures typically last a few minutes, and she can often sense their onset, characterized by severe heart pain. She attempts to mitigate the seizures by walking. She does not experience falls during these episodes, which occur randomly.  She had been on Lamictal  for mood stabilization/bipolar disorder through psychiatry for some time, but felt like she was moving in slow motion.  As a result, she decided to try to come off of this and instead was placed on Depakote which she started today.  She did not take lamotrigine  today. She has no other new medications. She has been under the care of a therapist for an extended period, with monthly sessions currently ongoing.  Her mother had TD at her current age, and her aunt experienced seizures following a car accident. She consulted a neurologist last month who attributed her symptoms to stress. An MRI was recommended by her doctor, revealing some abnormalities, prompting a referral to a different neurologist.  She is preparing for bypass surgery and has adopted a new diet. The surgery has been postponed due to her seizures.  Upon entering the room today, patient was having one of her events.  She was noted to have her eyes closed and was not responding.  I repeatedly asked her questions, but she was not able to respond.  At one point, she was opening up  her eyelid slightly and I seem to be upward and fluttering.  Upon nailbed pressure, she immediately started responding and was able to answer questions.  Vital signs were checked which were stable.  SOCIAL HISTORY:   Education Level: Graduated with a degree in media studies, currently pursuing a master's in education   Occupations: Runner, broadcasting/film/video (Fourth grade)   Diet: Adopted a new diet in preparation for bypass surgery   Alcohol: She does not consume alcohol   Tobacco: She smokes cigarettes  FAMILY HISTORY - Mother: Tardive dyskinesia (TD) at the same age as the patient. - Aunt: Seizures following a car accident. - Relative: Seizures during childhood, outgrew them.    Past Medical History, Past Surgery History, Social History, and Family History were reviewed and updated.    Past Medical History:  Diagnosis Date  . Anxiety   . Asthma (*)    as a chhild  . Bipolar 1 disorder (*)   . Seizures (*)    recent on set - 06/02/2024  . Type 2 diabetes mellitus without complication, without long-term current use of insulin (*) 06/08/2024    Past Surgical History:  Procedure Laterality Date  . Cesarean section  2023   Family History  Problem Relation Age of Onset  . Lung cancer Paternal Grandfather   . Breast cancer Paternal Grandmother   . No Known Problems Father   . Bipolar disorder Mother   . Depression Mother   . No Known Problems Brother   . Bipolar disorder Sister   . No Known  Problems Sister   . Colon cancer Neg Hx   . Ovarian cancer Neg Hx    Social History[1]    Current Home Medications  Medication Sig  divalproex sodium (DEPAKOTE ER) 500 mg 24 hr tablet Take one tablet (500 mg dose) by mouth daily.  lamoTRIgine  ER (LAMICTAL  XR) 50 mg tablet Take one tablet (50 mg dose) by mouth daily.    The patient has no known allergies.  Review of Systems is complete and negative except as noted in History of Present Illness.  Objective    PHYSICAL EXAM: BP 100/62 (BP Location:  Right Upper Arm, Patient Position: Sitting)   Pulse 81   Ht 5' 2 (1.575 m)   Wt 232 lb 6.4 oz (105.4 kg)   LMP 06/29/2024 (Approximate)   SpO2 97%   BMI 42.51 kg/m  GENERAL:  No acute distress.  EYES:   Pupils: pupils equally round, reactive  ENT:   Throat: oropharynx clear.  CARDIOVASCULAR:   Palpation/Auscultation: regular rate and rhythm.  RESPIRATORY:   clear to auscultation bilaterally.  SKIN:   Inspection: well perfused, no edema.   MENTAL STATUS EXAM: Orientation: Alert and oriented to person, place and time. Memory: Cooperative, follows commands well. Recent and remote memory normal.. Attention, concentration: Attention span and concentration are normal. Language: Speech is clear and language is normal. Fund of knowledge: Aware of current events, vocabulary appropriate for patient age.   CRANIAL NERVES: CN 2 (Optic): Visual fields intact to confrontation CN 3,4,6 (EOM): Pupils equal and reactive. Full extraocular eye movement without nystagmus. CN 5 (Trigeminal): Facial sensation is normal, no weakness of masticatory muscles. CN 7 (Facial): No facial weakness or asymmetry. CN 8 (Auditory): Auditory acuity grossly normal. CN 9,10 (Glossophar): The uvula is midline, the palate elevates symmetrically. CN 11 (spinal access): Normal sternocleidomastoid and trapezius strength. CN 12 (Hypoglossal): The tongue is midline. No atrophy or fasciculations.   MOTOR: Muscle Strength: Strength - 5/5 and symmetric in the upper and lower extremities, no pronation or drift. Muscle Tone: Tone and muscle bulk are normal in the upper and lower extremities.   REFLEXES:   DTRs - 1+ and symmetrical in all four extremities  COORDINATION:   Intact finger-to-nose, no tremor.   SENSATION:  Intact to light touch  GAIT: Routine gait is normal.    DIAGNOSTIC STUDIES:   MRI Brain 06/18/2024 No acute intracranial abnormality. Slight asymmetry of the temporal horns of lateral ventricles. The  left hippocampus and fornix, appear slightly smaller than the right side. Correlate for possible temporal lobe seizure activity. Mildly enlarged partially empty sella, atypical for age. 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 Paranasal sinus disease.  LABORATORY STUDIES:   Lab Results  Component Value Date   Glucose 84 06/08/2024   Cholesterol, Total 149 06/08/2024   Triglycerides 79 06/08/2024   HDL 51 06/08/2024   LDL 83 06/08/2024   Hemoglobin A1c 5.9 (H) 06/08/2024   TSH 1.860 06/08/2024   Vitamin B-12 743 06/08/2024   Folate 12.8 06/08/2024   RPR Non Reactive 08/28/2022   Total Protein 7.0 06/08/2024   Albumin/Globulin Ratio 1.7 06/12/2022   Gyn Note Comment 11/11/2023   Magnesium 2.0 06/08/2024      Assessment / Plan   Assessment & Plan 1. Seizure-like activity. 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  The MRI scan revealed a minor abnormality in the temporal lobe, which admittedly can be seen in some forms of temporal  lobe epilepsy.  However, they can also be more of a benign variant and not clinically relevant.  Given the imaging findings, an EEG can be obtained to look for any epileptiform discharges.  If negative, then this would further support nonepileptic origin to the events.  Even still, we discussed that nonepileptic events can still be very problematic and affect quality of life.  The difference with nonepileptic events is that they are not treated with antiepileptic medications, and instead are treated through psychiatry and counseling to try to uncover the underlying cause and contributing factors.  Certainly possible that changing her lamotrigine  that have been used for bipolar disorder has disrupted the brains compensatory mechanisms for certain situations and stressors which in turn have been activating more physical symptoms.  As such, working with her counselor will be imperative. She has been  advised to avoid driving and engaging in risky activities, as the state has been strict regulations regarding any loss of consciousness or awareness, regardless of the etiology.   2.  Prediabetes Follow-up with primary care  3.  Preparation for possible gastric bypass If EEG is normal, then there is no neurologic contraindication to her undergoing surgery or anesthesia.   Risks, benefits, and alternatives of the medications and treatment plan prescribed today were discussed, and patient expressed understanding and agreement with the plan.  All new prescription medications and changes in current prescription dosages were discussed with the patient, including patient education, medication name, use, dosage, potential side effects, drug interactions, consequences of not using/taking and special instructions. Patient expressed understanding. No barriers to adherence.  Documentation for time-based billing:  Total time spent of date of service was 60 minutes.  Patient care activities included preparing to see the patient such as reviewing the patient record, obtaining and/or reviewing separately obtained history, performing a medically appropriate history and physical examination, counseling and educating the patient, family, and/or caregiver, ordering prescription medications, tests, or procedures, documenting clinical information in the electronic or other health record, independently interpreting results when not separately reported, communicating results to the patient/family/caregiver, and coordinating the care of the patient when not separately reported.  Medford Blumenthal, MD 07/13/2024, 4:21 PM  Computer technology was used to create this visit note.  Consent for patient/caregiver was given obtained prior to its use.  *Additional portions of this note was dictated with voice recognition software. Inadvertently, similar sounding words can, sometimes, get transcribed incorrectly      [1] Social  History Socioeconomic History  . Marital status: Single  . Number of children: 0  Tobacco Use  . Smoking status: Never    Passive exposure: Never  . Smokeless tobacco: Never  Vaping Use  . Vaping status: Never Used  Substance and Sexual Activity  . Alcohol use: Yes    Comment: occasionally  . Drug use: Not Currently  . Sexual activity: Yes    Partners: Male    Birth control/protection: None

## 2024-07-15 NOTE — Progress Notes (Signed)
  ELECTROENCEPHALOGRAPHY  Date of Service : July 15, 2024  EEG Duration : 21 minutes and 18 seconds   Referring Provider : Dannielle       History : Ms. Jordan Williams is a 28 y.o. female referred for seizure like activity. EEG ordered to look for evidence of seizures and/or interictal discharges.   Current Medications : Lamictal , Depakote       Procedure : This is a routine 21 channel adult electroencephalogram with one channel dedicated to limited EKG recording. Various montages were used in the acquisition of the EEG. Activating procedures included photic stimulation and hyperventilation.  State : This study was obtained with the patient in the awake and drowsy state.       EEG Description :  The posterior dominant rhythm consisted of a frequency of 10 Hz with an amplitude ranging between 20-30 V.  It was found to be symmetric across the bilateral posterior derivations and attenuated with eye opening.  The remaining background consisted of alpha and was well organized, continuous, symmetric, and reactive.   Photic stimulation elicited a driving response at multiple flash frequencies. There was no slowing associated with hyperventilation.   Drowsiness was evidenced by decreased muscle artifact.   No stage N2 sleep architecture was attained.  There were no focal abnormalities, epileptiform abnormalities, or electrographic seizures.   Event During photic stimulation, the patient had eye fluttering and slight side-to-side head shaking.  She also had decreased responsiveness.  When asked questions, she was not responding, but later was able to remember 3 words that were given to her.  Patient started responding after about 30 seconds but appeared somewhat confused.      On EEG, there was a normal waking background with a well-formed posterior dominant rhythm, even when not responding to questions.  There was no epileptiform correlate before, during or after the event.       Conclusion: Nonepileptic event          EEG Interpretation : This is normal awake and drowsy EEG.  The event of eye fluttering, head shaking and unresponsiveness had no epileptiform correlate.  Clinical Correlation : While a normal EEG does not rule out epilepsy, there was no evidence of an underlying seizure disorder on this study.  The event captured was nonepileptic.  It should also be noted that events of unresponsiveness in the setting of a normal waking background with a well formed posterior dominant rhythm is often indicative of a psychogenic etiology.   Medford Dannielle, MD 07/15/2024, 1:17 PM

## 2024-07-21 DIAGNOSIS — E559 Vitamin D deficiency, unspecified: Secondary | ICD-10-CM | POA: Diagnosis not present

## 2024-07-21 DIAGNOSIS — E119 Type 2 diabetes mellitus without complications: Secondary | ICD-10-CM | POA: Diagnosis not present

## 2024-07-21 DIAGNOSIS — Z6841 Body Mass Index (BMI) 40.0 and over, adult: Secondary | ICD-10-CM | POA: Diagnosis not present

## 2024-07-21 DIAGNOSIS — E66813 Obesity, class 3: Secondary | ICD-10-CM | POA: Diagnosis not present

## 2024-07-21 DIAGNOSIS — E282 Polycystic ovarian syndrome: Secondary | ICD-10-CM | POA: Diagnosis not present

## 2024-07-21 DIAGNOSIS — F411 Generalized anxiety disorder: Secondary | ICD-10-CM | POA: Diagnosis not present

## 2024-07-21 DIAGNOSIS — F319 Bipolar disorder, unspecified: Secondary | ICD-10-CM | POA: Diagnosis not present

## 2024-07-21 DIAGNOSIS — R7303 Prediabetes: Secondary | ICD-10-CM | POA: Diagnosis not present

## 2024-07-22 NOTE — Discharge Summary (Signed)
 [facility]  Discharge Summary  PCP: Christopher Bohr, NP Discharge Details   Admit date:         07/21/2024 Discharge date:         07/22/2024  Hospital Days:    1 days  Weight DOS: Wt Readings from Last 1 Encounters:  07/21/24 231 lb 6.4 oz (105 kg)    Active Hospital Problems   Diagnosis Date Noted POA  . Class 3 severe obesity due to excess calories without serious comorbidity with body mass index (BMI) of 40.0 to 44.9 in adult 05/21/2024 Not Applicable  . GAD (generalized anxiety disorder) 09/30/2019 Unknown  . Bipolar disorder (*) 02/25/2018 Unknown  . Prediabetes 12/24/2017 Unknown  . Vitamin D deficiency 12/24/2017 Unknown    Resolved Hospital Problems  No resolved problems to display.     Hospital Course  Physicians involved in care during this hospitalization Attending Provider: Belvie KATHEE Endow, MD Admitting Provider: Belvie KATHEE Endow, MD Anesthesiologist: Debby FORBES Randy, MD  Indication for Admission: s/p Procedure(s): ERAS Robotic-assisted Laparoscopic Sleeve Gastrectomy Hospital Course:       No notes on file Patient presented to the hospital on 07/21/2024 for below procedure. She was admitted to the floor for observation and symptom control. She had improvement in symptoms and advancement in diet. Today she is tolerating pain, ambulation, and diet well.  On exam, she is well appearing, well nourished and in NAD. Her incisions are clean and dry without sign of infection and abdomen is soft, non distended, and non ttp.  She is found to be hemodynamically stable, afebrile, and ready for discharge home with outpatient follow-up.    [facility] post-discharge VTE risk calculator: 0.16%   Bedside Procedures   No orders found     Procedure(s) (LRB): ERAS Robotic-assisted Laparoscopic Sleeve Gastrectomy (N/A)  07/21/2024  Surgeon(s): Belvie KATHEE Endow, MD ------------------- [facility]   Activity  Instructions     Activity as tolerated     Shower/Bath     OK to shower      Diet Instructions     Encourage fluid intake     Full liquid diet for 2 weeks. 60g of protein, 64 oz of fluids      Other Instructions     Apply ice pack to operative site for the next 24 hours, alternating 30 minutes on, 30 minutes off or as instructed by physician     Discharge instructions     Ice to incisions 30 minutes on, 30 minutes off. Take 1,000 mg acetaminophen  (Tylenol ) every 8 hours. Take celecoxib (Celebrex) twice daily for 4 days as needed for pain. Take methocarbamol  (Robaxin ) every 8 hours as needed for muscle spasms. Take Hyoscyamine every 6 hours as needed for gas. If you still have pain not controlled with the over the counter medications, you can take oxycodone  as needed.  Remove Scopolamine  patch 72 hours after it was placed. Take Zofran  every 6 hours as needed for nausea. If constipated, take miralax as needed.  Take omeprazole 40mg  daily for 6 months after surgery.  Full liquid diet for 2 weeks after surgery.  60-60-60 rule. Drink 64 oz of fluids, 60 grams of protein and walk 60 minutes per day.   No heavy lifting, greater than 20 pounds for 4 weeks after surgery.    Shower daily.   Follow-up with me 1 week after surgery.   Follow-up with me     1 week postop   Reason  for referral: Postop   Referral Type: Consultation   Continuity of Care   Notify Physician for increased or unrelieved pain     Notify Physician for increased pain at the operative site (or unrelieved pain)     Notify Physician for marked redness     Notify Physician for persistent vomiting     Notify Physician for signs of infection (swelling, heat, redness, red streaks, pus formation, odor from incision or any other trouble at the surgical site)     Notify Physician for trouble breathing or symptoms that are worse     Notify Physician if unable to urinate     Notify physician - Call your doctor if you  experience nausea/vomiting     Notify physician - Contact your doctor for excessive bleeding     Notify physician - Temp     Call MD if Temperature above 101.5 Degrees F   Observe operative area for signs of infection     Temperature of 101.30F or above increase pain, redness or swelling, foul odor to drainage   Weight restrictions     Lifting restrictions 20  lbs for 4 weeks      Contact information for follow-up     Donnice MARLA Dollar, PA-C  Specialty: General Surgery, Physician Assistant  Relationship: Physician Assistant   [address] [place] [address]  Phone: [phone]     Next Steps: Follow up   Instructions: Postop   Christopher Bohr, NP  Specialty: Family Medicine, Occupational Medicine  Relationship: PCP - General   [address] [place] [address]  Phone: [phone]     Next Steps: Follow up      Appointments which have been scheduled    Jul 29, 2024 1:30 PM Postop with Belvie KATHEE Endow, MD [facility]) (--) [address] [place] [address] [phone]   Aug 04, 2024 1:00 PM MyChart Video Visit with Tereasa LOISE Ege, RD [facility] Surgical Weight Loss - [facility] (--) [address] [place] [address] [phone]   Aug 11, 2024 4:00 PM Postop with Donnice Rockey Dollar, PA-C [facility] Surgical Weight Loss - [address] (--) [address] [place] [address] [phone]   Aug 26, 2024 4:00 PM Postop with Belvie KATHEE Endow, MD [facility]) (--) [address] [place] [address] [phone]   Sep 01, 2024 3:30 PM MyChart Video Visit with Tereasa LOISE Ege, RD [facility] Surgical Weight Loss - [facility] (--) [address] [place] [address] [phone]   Sep 16, 2024 4:00 PM MyChart Video Visit with Aleck Sor, LCSW [facility] Surgical [facility] Loss - [facility] (--) [address] [place] [address] [phone]   Oct 27, 2024 4:00 PM Follow Up Appointment with Donnice Rockey Dollar, PA-C [facility] Surgical Weight Loss - [facility] (--) [address] [place] [address] [phone]   Jan 18, 2025 3:30 PM MyChart Video Visit with Tereasa LOISE Ege, RD [facility] Surgical Weight Loss - [address] (--) [address] [place] [address] [phone]   Jan 20, 2025 3:30 PM BRIEF FOLLOW UP with Belvie KATHEE Endow, MD [facility]) (--) [address] [place] [address] [phone]      Unresulted Labs     Order Current Status   Pathology Tissue Request In process      Code Status: Full Code   Electronically signed: Donnice MARLA Dollar, PA-C 07/22/2024 / 1:27 PM

## 2024-09-16 ENCOUNTER — Emergency Department (HOSPITAL_COMMUNITY)
Admission: EM | Admit: 2024-09-16 | Discharge: 2024-09-16 | Disposition: A | Discharge status: Home / Self Care | Attending: Emergency Medicine | Admitting: Emergency Medicine

## 2024-09-16 DIAGNOSIS — Z5321 Procedure and treatment not carried out due to patient leaving prior to being seen by health care provider: Secondary | ICD-10-CM | POA: Diagnosis not present

## 2024-09-16 DIAGNOSIS — R569 Unspecified convulsions: Secondary | ICD-10-CM | POA: Diagnosis present

## 2024-09-16 DIAGNOSIS — F445 Conversion disorder with seizures or convulsions: Secondary | ICD-10-CM

## 2024-09-16 LAB — CBG MONITORING, ED
Glucose-Capillary: 60 mg/dL — ABNORMAL LOW (ref 70–99)
Glucose-Capillary: 70 mg/dL (ref 70–99)

## 2024-09-16 LAB — HCG, SERUM, QUALITATIVE: Preg, Serum: NEGATIVE

## 2024-09-16 NOTE — ED Notes (Signed)
 Pt left AMA, IV removed by triage team prior to leaving

## 2024-09-16 NOTE — Discharge Instructions (Addendum)
 1.  Complete your appointment with Gailen Ivans at [facility] as planned today. 2.  You have had evaluation with MRI and EEG.  Your seizures are NOT being caused by a brain problem epilepsy. 3.  Nonepileptic seizures are often caused by different types of stressors or stress response.  This is why it is very important that you complete your evaluation and treatment with all of your outpatient providers and especially with your psychiatric counselor who can help identify ways to manage stressors and triggers. 4.  Follow-up with your family doctor for recheck as soon as possible.  Follow all instructions provided by your neurologist and make all follow-up appointments as planned.

## 2024-09-16 NOTE — ED Triage Notes (Signed)
 Pt BIBA for seizure activity.  Per EMS pt having absent seizure activity.  Takes Lamotrigine  for seizures.  Denies falls or injury.    CBG 88 V/S within range for EMS

## 2024-09-16 NOTE — ED Provider Notes (Signed)
 [place] EMERGENCY DEPARTMENT AT [facility] Provider Note   CSN: [id number] Arrival date & time: 09/16/24  1248     Patient presents with: Seizures   Jordan Williams is a 28 y.o. female.   HPI Patient has seizure-like activity today.  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  Typically the patient's eyes rolled back in flutter.  She might put her head down.  She might then become very sleepy and not talk much.  Patient reports she is not exactly sure what happens because she is symptomatic at that time.  There was no fall or injury associated with this event.  No interventions required. She was referred to come to the emergency department because this was witnessed by some coworkers.  EMS records patient having absent Seizure Activity.  Patient does endorse that she is under a lot of stressors currently.  There is an impending marriage.  She reports also she had a meeting with her advisor regarding some academic assignments which was also very stressful.  Patient reports she takes Lamictal  as prescribed.    Prior to Admission medications   Medication Sig Start Date End Date Taking? Authorizing Provider  lamoTRIgine  (LAMICTAL  XR) 50 MG 24 hour tablet Take 1 tablet (50 mg total) by mouth daily. 06/08/24   Camara, Amadou, MD    Allergies: Patient has no known allergies.    Review of Systems  Updated Vital Signs BP 136/75 (BP Location: Left Arm)   Pulse (!) 58   Temp 98.2 F (36.8 C) (Oral)   Resp 18   LMP  (LMP Unknown)   SpO2 100%   Physical Exam Constitutional:      Comments: Alert nontoxic.  Well-nourished well-developed.  No acute distress.  HENT:     Head: Normocephalic and atraumatic.     Mouth/Throat:     Pharynx: Oropharynx is clear.  Eyes:     Extraocular Movements: Extraocular movements intact.     Conjunctiva/sclera: Conjunctivae normal.     Pupils: Pupils are equal, round, and  reactive to light.  Cardiovascular:     Rate and Rhythm: Normal rate and regular rhythm.  Pulmonary:     Effort: Pulmonary effort is normal.     Breath sounds: Normal breath sounds.  Abdominal:     General: There is no distension.     Palpations: Abdomen is soft.     Tenderness: There is no abdominal tenderness. There is no guarding.  Musculoskeletal:        General: No swelling or tenderness. Normal range of motion.     Cervical back: Neck supple.     Right lower leg: No edema.     Left lower leg: No edema.  Skin:    General: Skin is warm and dry.  Neurological:     General: No focal deficit present.     Mental Status: She is oriented to person, place, and time.     Cranial Nerves: No cranial nerve deficit.     Motor: No weakness.     Coordination: Coordination normal.  Psychiatric:     Comments: Affect is slightly flat.  Patient is calm and interactive.  No signs of confusion.     (all labs ordered are listed, but only abnormal results are displayed) Labs Reviewed  CBG MONITORING, ED - Abnormal; Notable for the following components:      Result Value   Glucose-Capillary 60 (*)    All  other components within normal limits  HCG, SERUM, QUALITATIVE  CBG MONITORING, ED    EKG: EKG Interpretation Date/Time:  Thursday September 16 2024 13:13:10 EDT Ventricular Rate:  65 PR Interval:  145 QRS Duration:  88 QT Interval:  365 QTC Calculation: 380 R Axis:   14  Text Interpretation: Sinus rhythm RSR' in V1 or V2, probably normal variant normal, no change from previous Confirmed by Armenta Canning [phone]) on 09/16/2024 2:07:57 PM  Radiology: No results found.   Procedures   Medications Ordered in the ED - No data to display                                  Medical Decision Making Amount and/or Complexity of Data Reviewed Labs: ordered.   Patient presents as outlined.  Review of EMR indicates patient had EEG [phone] and seizure workup negative for epileptic  type seizure.  MRI with and without contrast done 6\27\2025.  Patient is established with neurology with the [facility].  At this time she presents with typical symptoms.  Patient is clinically well in appearance.  I do not feel that she needs any advanced imaging currently.  CBG 70 Pregnancy test negative  Patient clinically well in appearance.  At this time she is presenting with well-documented presentation of seizure-like activity described in other encounters.  Patient has follow-up with her mental health provider.  Stable for discharge     Final diagnoses:  Psychogenic nonepileptic seizure    ED Discharge Orders     None          Armenta Canning, MD 10/04/24 1409
